# Patient Record
Sex: Male | Born: 1935
Health system: Southern US, Community
[De-identification: ages and names within clinical notes are randomized; demographics above are authoritative.]

## PROBLEM LIST (undated history)

## (undated) DIAGNOSIS — H9319 Tinnitus, unspecified ear: Secondary | ICD-10-CM

## (undated) DIAGNOSIS — R972 Elevated prostate specific antigen [PSA]: Secondary | ICD-10-CM

## (undated) DIAGNOSIS — Z860100 Personal history of colon polyps, unspecified: Secondary | ICD-10-CM

## (undated) DIAGNOSIS — M545 Low back pain, unspecified: Secondary | ICD-10-CM

## (undated) DIAGNOSIS — R498 Other voice and resonance disorders: Secondary | ICD-10-CM

## (undated) DIAGNOSIS — K219 Gastro-esophageal reflux disease without esophagitis: Secondary | ICD-10-CM

## (undated) HISTORY — PX: TONSILLECTOMY: SHX5217

## (undated) HISTORY — DX: Gastro-esophageal reflux disease without esophagitis: K21.9

## (undated) HISTORY — DX: Low back pain: M54.5

## (undated) HISTORY — PX: CATARACT EXTRACTION W/ INTRAOCULAR LENS  IMPLANT, BILATERAL: SHX1307

## (undated) HISTORY — DX: Elevated prostate specific antigen (PSA): R97.20

## (undated) HISTORY — PX: OTHER SURGICAL HISTORY: SHX169

## (undated) HISTORY — DX: Low back pain, unspecified: M54.50

## (undated) HISTORY — DX: Tinnitus, unspecified ear: H93.19

## (undated) HISTORY — DX: Personal history of colon polyps, unspecified: Z86.0100

## (undated) HISTORY — DX: Personal history of colonic polyps: Z86.010

## (undated) HISTORY — DX: Other voice and resonance disorders: R49.8

---

## 1998-11-30 HISTORY — PX: CATARACT EXTRACTION: SUR2

## 2000-03-25 ENCOUNTER — Other Ambulatory Visit: Admission: RE | Admit: 2000-03-25 | Discharge: 2000-03-25 | Payer: Self-pay | Admitting: Gastroenterology

## 2000-03-25 ENCOUNTER — Encounter (INDEPENDENT_AMBULATORY_CARE_PROVIDER_SITE_OTHER): Payer: Self-pay | Admitting: Specialist

## 2005-04-03 ENCOUNTER — Ambulatory Visit: Payer: Self-pay | Admitting: Internal Medicine

## 2005-04-14 ENCOUNTER — Ambulatory Visit: Payer: Self-pay

## 2005-04-21 ENCOUNTER — Ambulatory Visit: Payer: Self-pay | Admitting: Internal Medicine

## 2005-05-28 ENCOUNTER — Ambulatory Visit: Payer: Self-pay | Admitting: Internal Medicine

## 2006-03-23 ENCOUNTER — Ambulatory Visit: Payer: Self-pay | Admitting: Internal Medicine

## 2006-03-29 ENCOUNTER — Ambulatory Visit: Payer: Self-pay | Admitting: Internal Medicine

## 2006-03-31 ENCOUNTER — Ambulatory Visit: Payer: Self-pay | Admitting: Internal Medicine

## 2007-03-08 ENCOUNTER — Ambulatory Visit: Payer: Self-pay | Admitting: Internal Medicine

## 2007-11-14 ENCOUNTER — Encounter: Payer: Self-pay | Admitting: Internal Medicine

## 2007-11-14 ENCOUNTER — Ambulatory Visit: Payer: Self-pay | Admitting: Internal Medicine

## 2007-11-14 DIAGNOSIS — R972 Elevated prostate specific antigen [PSA]: Secondary | ICD-10-CM

## 2007-11-14 DIAGNOSIS — Z860100 Personal history of colon polyps, unspecified: Secondary | ICD-10-CM | POA: Insufficient documentation

## 2007-11-14 DIAGNOSIS — Z8619 Personal history of other infectious and parasitic diseases: Secondary | ICD-10-CM

## 2007-11-14 DIAGNOSIS — Z8719 Personal history of other diseases of the digestive system: Secondary | ICD-10-CM

## 2008-04-30 ENCOUNTER — Ambulatory Visit: Payer: Self-pay | Admitting: Internal Medicine

## 2008-04-30 DIAGNOSIS — R498 Other voice and resonance disorders: Secondary | ICD-10-CM

## 2008-04-30 DIAGNOSIS — I739 Peripheral vascular disease, unspecified: Secondary | ICD-10-CM

## 2008-05-07 ENCOUNTER — Encounter: Payer: Self-pay | Admitting: Internal Medicine

## 2008-07-30 ENCOUNTER — Encounter: Payer: Self-pay | Admitting: Internal Medicine

## 2009-02-27 ENCOUNTER — Encounter (INDEPENDENT_AMBULATORY_CARE_PROVIDER_SITE_OTHER): Payer: Self-pay | Admitting: *Deleted

## 2009-06-17 ENCOUNTER — Ambulatory Visit: Payer: Self-pay | Admitting: Internal Medicine

## 2009-06-17 DIAGNOSIS — H9319 Tinnitus, unspecified ear: Secondary | ICD-10-CM | POA: Insufficient documentation

## 2009-07-15 ENCOUNTER — Encounter: Payer: Self-pay | Admitting: Internal Medicine

## 2009-10-07 ENCOUNTER — Encounter: Payer: Self-pay | Admitting: Internal Medicine

## 2009-10-28 ENCOUNTER — Encounter: Payer: Self-pay | Admitting: Internal Medicine

## 2010-01-14 ENCOUNTER — Encounter: Payer: Self-pay | Admitting: Internal Medicine

## 2010-04-04 ENCOUNTER — Ambulatory Visit: Payer: Self-pay | Admitting: Internal Medicine

## 2010-04-04 DIAGNOSIS — M545 Low back pain: Secondary | ICD-10-CM

## 2010-04-25 ENCOUNTER — Encounter: Payer: Self-pay | Admitting: Internal Medicine

## 2010-12-30 NOTE — Consult Note (Signed)
Summary: [facility]  [facility]   Imported By: Lester Glenwood 05/01/2010 11:48:53  _____________________________________________________________________  External Attachment:    Type:   Image     Comment:   External Document

## 2010-12-30 NOTE — Letter (Signed)
Summary: Health Screening/HRA  Health Screening/HRA   Imported By: Sherian Rein 02/24/2010 13:17:19  _____________________________________________________________________  External Attachment:    Type:   Image     Comment:   External Document

## 2010-12-30 NOTE — Assessment & Plan Note (Signed)
Summary: YEARLY FU/MEDICARE/ STATES INSURANCE WILL PAY IF EARLY/NWS   Vital Signs:  Patient profile:   77 year old male Height:      69 inches Weight:      148 pounds BMI:     21.93 O2 Sat:      95 % on Room air Temp:     98.4 degrees F oral Pulse rate:   57 / minute BP sitting:   110 / 80  (left arm) Cuff size:   regular  Vitals Entered By: Bill Salinas CMA (Apr 04, 2010 11:33 AM)  O2 Flow:  Room air CC: cpx/ ab   Primary Care Provider:  Joani Cosma  CC:  cpx/ ab.  History of Present Illness: Patient presents for routine medical follow-up. He reports since his cataract surgey he has worsening presbyopia; he has tinnitis with full evaluation with ENT and follow-up. He reports the symptoms are intermittent. He has low back pain which is chronic problem. He did see improvement with routine back exercise. He finds that driving makes it worse. Using a lumbar support when driving reduces the amount of discomfort. He denies weakness, loss of sensation, incontinence or limitation in his ADLs.   Current Medications (verified): 1)  Aspirin 81 Mg  Tabs (Aspirin) .... Take 1 Tablet By Mouth Once A Day 2)  Omeprazole 40 Mg  Cpdr (Omeprazole) .Marland Kitchen.. 1 By Mouth Qam  Allergies (verified): No Known Drug Allergies  Past History:  Past Medical History: LOW BACK PAIN, CHRONIC (ICD-724.2) TINNITUS, CHRONIC, BILATERAL (ICD-388.30) PVD (ICD-443.9) HOARSENESS, CHRONIC (ICD-784.49) * TRANSRECTAL PROSTATE BIOPSIES THAT WERE NEGATIVE Hx of ELEVATED PROSTATE SPECIFIC ANTIGEN (ICD-790.93) MUMPS, HX OF (ICD-V12.09) DIVERTICULITIS, HX OF (ICD-V12.79) COLONIC POLYPS, HX OF (ICD-V12.72)  Past Surgical History: Tonsillectomy * PUNCH BIOPSY OF A LESION ON FOREHEAD * TRANSRECTAL PROSTATE BIOPSIES THAT WERE NEGATIVE BILATERAL CATARACT EXTRACTION WITH IOL  Review of Systems  The patient denies anorexia, fever, weight loss, weight gain, hoarseness, chest pain, dyspnea on exertion, peripheral edema,  headaches, hemoptysis, abdominal pain, hematochezia, incontinence, muscle weakness, transient blindness, difficulty walking, unusual weight change, enlarged lymph nodes, and angioedema.    Physical Exam  General:  Well-developed,well-nourished,in no acute distress; alert,appropriate and cooperative throughout examination Head:  Normocephalic and atraumatic without obvious abnormalities. No apparent alopecia or balding. Eyes:  vision grossly intact, pupils equal, and pupils round.   Ears:  R ear normal and L ear normal.   Nose:  no external deformity and no external erythema.   Mouth:  good dentition and no dental plaque.   Neck:  No deformities, masses, or tenderness noted. Chest Wall:  No deformities, masses, tenderness or gynecomastia noted. Lungs:  Normal respiratory effort, chest expands symmetrically. Lungs are clear to auscultation, no crackles or wheezes. Heart:  Normal rate and regular rhythm. S1 and S2 normal without gallop, murmur, click, rub or other extra sounds. Abdomen:  soft, non-tender, normal bowel sounds, no distention, and no hepatomegaly.   Rectal:  no external abnormalities and normal sphincter tone.   Genitalia:  circumcised.   Prostate:  Prostate gland firm and smooth, no enlargement, nodularity, tenderness, mass, asymmetry or induration. Msk:  normal ROM, no joint tenderness, no joint swelling, no redness over joints, and no joint deformities.   Pulses:  2+ radial, 2+ PT, 1+ DP, good capillary refill Extremities:  No clubbing, cyanosis, edema, or deformity noted with normal full range of motion of all joints.   Neurologic:  alert & oriented X3, cranial nerves II-XII intact, strength normal  in all extremities, gait normal, and DTRs symmetrical and normal.   Skin:  turgor normal, color normal, no suspicious lesions, and no ulcerations.   Cervical Nodes:  no anterior cervical adenopathy and no posterior cervical adenopathy.   Axillary Nodes:  no R axillary adenopathy and  no L axillary adenopathy.   Psych:  Oriented X3, memory intact for recent and remote, and normally interactive.     Impression & Recommendations:  Problem # 1:  LOW BACK PAIN, CHRONIC (ICD-724.2) Patient with a normal back exam - no radicular symptoms. Suspect DJD related chronic back pain.  Plan - continue to do back exercises.           continue to use low back/lumbar spine support when driving.  His updated medication list for this problem includes:    Aspirin 81 Mg Tabs (Aspirin) .Marland Kitchen... Take 1 tablet by mouth once a day  Problem # 2:  TINNITUS, CHRONIC, BILATERAL (ICD-388.30) Patient with tinnitis. He has seen Dr. Jenne Pane and is scheduled for follow-up with audiology. He may be a candidate for amplification.  Problem # 3:  PVD (ICD-443.9) Patient with a positive family h/o PAD and a heightened sense of concern. Exam today reveals excellent posterior tibial pulse with a weak Doralis pedis pulse. He has good capillary refill and absolutely no signs of PAD.  Plan - routine follow-up  Problem # 4:  Hx of ELEVATED PROSTATE SPECIFIC ANTIGEN (ICD-790.93) Normal prostate exam. He previous workup was negative.  Problem # 5:  Preventive Health Care (ICD-V70.0) Normal exam. Patient has lab done at the [place]. He has had normal labs in the past. He will forward to me labs when done at [place]. He is current for colorectal cancer screening. Pneumonia vaccine in '04, zoster vaccine in '07.  IN summary  a healthy appearing man, looking younger than his stated age who appears medically stable. He will return in 1 year or as needed.   Complete Medication List: 1)  Aspirin 81 Mg Tabs (Aspirin) .... Take 1 tablet by mouth once a day 2)  Omeprazole 40 Mg Cpdr (Omeprazole) .Marland Kitchen.. 1 by mouth qam   Not Administered:    Influenza Vaccine not given due to: declined

## 2011-04-03 ENCOUNTER — Other Ambulatory Visit (INDEPENDENT_AMBULATORY_CARE_PROVIDER_SITE_OTHER): Payer: Medicare Other | Admitting: Internal Medicine

## 2011-04-03 ENCOUNTER — Other Ambulatory Visit (INDEPENDENT_AMBULATORY_CARE_PROVIDER_SITE_OTHER): Payer: Medicare Other

## 2011-04-03 ENCOUNTER — Other Ambulatory Visit: Payer: Self-pay | Admitting: Internal Medicine

## 2011-04-03 DIAGNOSIS — Z79899 Other long term (current) drug therapy: Secondary | ICD-10-CM

## 2011-04-03 DIAGNOSIS — Z0389 Encounter for observation for other suspected diseases and conditions ruled out: Secondary | ICD-10-CM

## 2011-04-03 DIAGNOSIS — Z125 Encounter for screening for malignant neoplasm of prostate: Secondary | ICD-10-CM

## 2011-04-03 DIAGNOSIS — Z0000 Encounter for general adult medical examination without abnormal findings: Secondary | ICD-10-CM

## 2011-04-03 DIAGNOSIS — Z13220 Encounter for screening for lipoid disorders: Secondary | ICD-10-CM

## 2011-04-03 LAB — LIPID PANEL
Cholesterol: 222 mg/dL — ABNORMAL HIGH (ref 0–200)
HDL: 100.4 mg/dL (ref >39.00)
Triglycerides: 32 mg/dL (ref 0.0–149.0)

## 2011-04-03 LAB — URINALYSIS
Bilirubin Urine: NEGATIVE
Hgb urine dipstick: NEGATIVE
Ketones, ur: NEGATIVE
Nitrite: NEGATIVE
Total Protein, Urine: NEGATIVE
Urine Glucose: NEGATIVE
pH: 7 (ref 5.0–8.0)

## 2011-04-03 LAB — HEPATIC FUNCTION PANEL
ALT: 16 U/L (ref 0–53)
AST: 19 U/L (ref 0–37)
Albumin: 3.9 g/dL (ref 3.5–5.2)
Alkaline Phosphatase: 62 U/L (ref 39–117)

## 2011-04-03 LAB — CBC WITH DIFFERENTIAL/PLATELET
Basophils Absolute: 0 10*3/uL (ref 0.0–0.1)
Eosinophils Absolute: 0.3 10*3/uL (ref 0.0–0.7)
Hemoglobin: 15.8 g/dL (ref 13.0–17.0)
Lymphocytes Relative: 25.1 % (ref 12.0–46.0)
Lymphs Abs: 2 10*3/uL (ref 0.7–4.0)
MCHC: 33.8 g/dL (ref 30.0–36.0)
Monocytes Relative: 8.2 % (ref 3.0–12.0)
Neutro Abs: 5 10*3/uL (ref 1.4–7.7)
Platelets: 153 10*3/uL (ref 150.0–400.0)
RDW: 13.8 % (ref 11.5–14.6)

## 2011-04-03 LAB — BASIC METABOLIC PANEL
Chloride: 105 mEq/L (ref 96–112)
GFR: 53.35 mL/min — ABNORMAL LOW (ref >60.00)
Potassium: 4.5 mEq/L (ref 3.5–5.1)
Sodium: 140 mEq/L (ref 135–145)

## 2011-04-03 LAB — PSA: PSA: 2.03 ng/mL (ref 0.10–4.00)

## 2011-04-03 LAB — TSH: TSH: 1.92 u[IU]/mL (ref 0.35–5.50)

## 2011-04-09 ENCOUNTER — Encounter: Payer: Self-pay | Admitting: Internal Medicine

## 2011-04-10 ENCOUNTER — Encounter: Payer: Self-pay | Admitting: Internal Medicine

## 2011-04-10 ENCOUNTER — Ambulatory Visit (INDEPENDENT_AMBULATORY_CARE_PROVIDER_SITE_OTHER): Payer: Medicare Other | Admitting: Internal Medicine

## 2011-04-10 VITALS — BP 108/60 | HR 68 | Temp 98.8°F | Resp 14 | Ht 67.0 in | Wt 150.4 lb

## 2011-04-10 DIAGNOSIS — Z136 Encounter for screening for cardiovascular disorders: Secondary | ICD-10-CM

## 2011-04-10 NOTE — Progress Notes (Signed)
Subjective:    Patient ID: Nicholas Frye, male    DOB: July 25, 1930, 77 y.o.   MRN: [id number]  HPI The patient is here for annual Medicare wellness examination and management of other chronic and acute problems. He is feeling well and doing well.   The risk factors are reflected in the social history.  The roster of all physicians providing medical care to patient - is listed in the Snapshot section of the chart.  Activities of daily living:  The patient is 100% inedpendent in all ADLs: dressing, toileting, feeding as well as independent mobility  Home safety : The patient has smoke detectors in the home. They wear seatbelts.  Firearms are present in the home, kept in a safe fashion. There is no violence in the home.   There is no risks for hepatitis, STDs or HIV. There is no  history of blood transfusion. They have no travel history to infectious disease endemic areas of the world.  The patient has seen their dentist in the last six month. They have seen their eye doctor in the last year. They admit to any hearing difficulty and have had audiologic testing in the last year.  They do not  have excessive sun exposure. Discussed the need for sun protection: hats, long sleeves and use of sunscreen if there is significant sun exposure.   Diet: the importance of a healthy diet is discussed. They do have a healthy  diet.  The patient has a regular exercise program: swimming , 35 min duration,  3per week.  The benefits of regular aerobic exercise were discussed.  Depression screen: there are no signs or vegative symptoms of depression- irritability, change in appetite, anhedonia, sadness/tearfullness.  Cognitive assessment: the patient manages all their financial and personal affairs and is actively engaged. They could relate day,date,year and events; recalled 3/3 objects at 3 minutes; performed clock-face test normally.  The following portions of the patient's history were reviewed and  updated as appropriate: allergies, current medications, past family history, past medical history,  past surgical history, past social history  and problem list.  Vision, hearing, body mass index were assessed and reviewed.   During the course of the visit the patient was educated and counseled about appropriate screening and preventive services including : fall prevention , diabetes screening, nutrition counseling, colorectal cancer screening, and recommended immunizations.  Past Medical History  Diagnosis Date  . Lumbago   . Unspecified tinnitus   . Peripheral vascular disease, unspecified   . 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 Elevated prostate specific antigen (PSA)   . Personal history of other infectious and parasitic disease   . Personal history of other diseases of digestive system   . Personal history of colonic polyps   . GERD (gastroesophageal reflux disease)    Past Surgical History  Procedure Date  . Tonsillectomy   . Punch biopsy of a lesion on forehead   . Transrectal prostate biopsies that were negative   . Cataract extraction w/ intraocular lens  implant, bilateral    Family History  Problem Relation Age of Onset  . Peripheral vascular disease Mother   . Other Mother     bilateral Leg amputation  . Coronary artery disease Father   . Heart attack Father   . Heart disease Father     heart failure/ acute MI  . Peripheral vascular disease Sister   . Coronary artery disease Brother  valve replaced  . Heart disease Brother     MI x2, CABG, AVR  . Cancer Neg Hx     colon or prostate  . Diabetes Neg Hx    History   Social History  . Marital Status: Married    Spouse Name: N/A    Number of Children: N/A  . Years of Education: N/A   Occupational History  .      retired-still works at [employer]   Social History Main Topics  . Smoking status: Never Smoker   . Smokeless tobacco: Not on file  . Alcohol Use: 1.0 oz/week     2 drink(s) per week  . Drug Use: No  . Sexually Active: No   Other Topics Concern  . Not on file   Social History Narrative   HSG. Married '59. 2 daughters - '68, '71; 2 grandsons, 1 granddaughter, 2 step-grandchildren. Work: retired from [employer], works at [employer] 2 days /week.  End of life: DNR, no prolonged mechanical ventilation, no heroic or futile measures.    Current Outpatient Prescriptions on File Prior to Visit  Medication Sig Dispense Refill  . aspirin 81 MG tablet Take 81 mg by mouth daily.        Marland Kitchen omeprazole (PRILOSEC) 40 MG capsule Take 40 mg by mouth every other day.            Review of Systems Review of Systems  Constitutional:  Negative for fever, chills, activity change and unexpected weight change.  HENT:  Negative for hearing loss, ear pain, congestion, neck stiffness and postnasal drip.   Eyes: Negative for pain, discharge and visual disturbance.  Respiratory: Negative for chest tightness and wheezing.   Cardiovascular: Negative for chest pain and palpitations.       [No decreased exercise tolerance Gastrointestinal: [No change in bowel habit. No bloating or gas. No reflux or indigestion Genitourinary: Negative for urgency, frequency, flank pain and difficulty urinating.  Musculoskeletal: Negative for myalgias, back pain, arthralgias and gait problem.  Neurological: Negative for dizziness, tremors, weakness and headaches.  Hematological: Negative for adenopathy.  Psychiatric/Behavioral: Negative for behavioral problems and dysphoric mood.       Objective:   Physical Exam Constitutional: He is oriented to person, place, and time. He appears well-developed and well-nourished.       Healthy appearing white male in no acute distress  HENT:  Head: Normocephalic and atraumatic.  Right Ear: External ear normal. EAC/TM nl Left Ear: External ear normal.  EAC/TM nl Nose: Nose normal.  Mouth/Throat: Oropharynx is clear and moist.  Eyes:  Conjunctivae and EOM are normal. Pupils are equal, round, and reactive to light. Right eye exhibits no discharge. Left eye exhibits no discharge. No scleral icterus.  Neck: Normal range of motion. Neck supple. No JVD present. No tracheal deviation present. No thyromegaly present.  Cardiovascular: Normal rate, regular rhythm and normal heart sounds.  Exam reveals no gallop and no friction rub.   No murmur heard.      Quiet precordium. 2+ radial, 1+ to trace DP, 2+ PT  Pulses. Mildly slowed capillary refill distal foot and toes.  Pulmonary/Chest: Effort normal. No respiratory distress. He has no wheezes. He has no rales. He exhibits no tenderness.       No chest wall deformity  Abdominal: Soft. Bowel sounds are normal. He exhibits no distension. There is no tenderness. There is no rebound and no guarding.       No heptosplenomegaly  Genitourinary: Prostate exam  deferred to normal PSA Musculoskeletal: Normal range of motion. He exhibits no edema and no tenderness.       Small and large joints without redness, synovial thickening or deformity. Full range of motion preserved about all small, median and large joints.  Lymphadenopathy:    He has no cervical adenopathy.  Neurological: He is alert and oriented to person, place, and time. He has normal reflexes. No cranial nerve deficit. Coordination normal.  Skin: Skin is warm and dry. No rash noted. No erythema.  Psychiatric: He has a normal mood and affect. His behavior is normal. Thought content normal.   Lab Results  Component Value Date   WBC 8.0 04/03/2011   HGB 15.8 04/03/2011   HCT 46.9 04/03/2011   PLT 153.0 04/03/2011   CHOL 222* 04/03/2011   TRIG 32.0 04/03/2011   HDL 100.40 04/03/2011   LDLDIRECT 101.3 04/03/2011   ALT 16 04/03/2011   AST 19 04/03/2011   NA 140 04/03/2011   K 4.5 04/03/2011   CL 105 04/03/2011   CREATININE 1.4 04/03/2011   BUN 17 04/03/2011   CO2 27 04/03/2011   TSH 1.92 04/03/2011   PSA 2.03 04/03/2011          Assessment & Plan:  1.  Healthy maintenance - interval history is unremarkable with no medical illness, surgery or injury. Physical exam is normal. Lab results reveal a superb lipid panel with an LDL/HDL ratio that is at unity!. He is current with colorectal cancer screening with last exam in '04. He is current with prostate cancer screening with PSA as noted. Immunizations: Tetnus '04; pneumonia vaccine '04; Shingels '07.  12 Lead EKG without ischemia or sign of injury.  In summary- a very nice gentleman who is medically stable and doing well. He will continue to take omeprazole for minor dyspepsia and aspirin as prophylaxis. He will return as needed or in 1 year.

## 2011-04-17 NOTE — Assessment & Plan Note (Signed)
[facility]                           PRIMARY CARE OFFICE NOTE   Nicholas Frye, Nicholas Frye                   MRN:          [id number]  DATE:03/08/2007                            DOB:          01/01/1930    Nicholas Frye is a pleasant 77 year old gentleman who presents today for  followup evaluation and exam.  He reports that in the interval since his  last examination of March 29, 2006 that he has been doing well, with no  significant change in his medical condition.  Of note after his last  physical exam, he did get the Zostavax immunization at [facility] and was reimbursed by his insurance company.   PAST MEDICAL HISTORY:  Past medical history is well-documented in my  note of Apr 03, 2005 as is his family history and social history with no  significant changes, except he now has three grandchildren, two step-  grandchildren.  He has been married for 49 years.  He does continue to  work two days a week at the [employer].   CURRENT MEDICATIONS:  Aspirin 325 mg daily.   REVIEW OF SYSTEMS:  The patient has had no fevers, sweats, chills, or  other constitutional symptoms.  His last eye exam was in the last 12  months, and he does have a cataract on the left eye and may be a  candidate for extraction in the future.  The patient reports he needs a  new crown but otherwise no dental problems.  No cardiovascular,  respiratory, or GI complaints.  GU is significant for nocturia x1-2 and  mild symptoms of prostatism with decreased force of stream and  occasional postvoid dribble.  Musculoskeletal review is significant for  right shoulder discomfort and ongoing low back pain, although improved  with regular back exercises.  No dermatologic or neurologic complaints.   PHYSICAL EXAMINATION:  VITAL SIGNS:  Temperature 98.3, blood pressure  128/70, pulse 62, weight 155.  GENERAL:  This is a well-nourished, well-developed gentleman who  looks  his stated age and is in no acute distress.  HEENT:  Normocephalic, atraumatic.  EACs and TMs were unremarkable.  Oropharynx with native dentition in good repair.  No buccal or palatal  lesions were noted.  The posterior pharynx was clear.  Conjunctivae and  sclerae were clear.  PERRLA.  EOMI.  Funduscopic exam revealed normal  disk margins with no vascular abnormalities.  I did not appreciate any  significant lens opacification on the left.  NECK:  Supple without thyromegaly.  No lymphadenopathy was noted in the  cervical or supraclavicular regions.  CHEST:  No CVA tenderness.  Lungs were clear to auscultation and  percussion.  CARDIOVASCULAR:  2+ radial pulses.  He had a weak dorsalis pedis pulse  bilaterally.  He had normal posterior tibial pulses.  He had no JVD, no  carotid bruits.  He had a quiet precordium with a regular rate and  rhythm without murmurs, rubs, or gallops.  ABDOMEN:  Soft.  No guarding or rebound.  No organosplenomegaly was  noted.  RECTAL:  Normal sphincter tone.  The  prostate was smooth, round, and  normal in size and contour with well-preserved sulcus.  EXTREMITIES:  Without clubbing, cyanosis, edema, or deformity.  NEUROLOGIC:  Nonfocal.  DERMATOLOGIC:  The patient has no worrisome skin lesions.  He has  several cherry angiomas.  He has a non-worrisome mole on his left  axilla.   The patient did not have new laboratory, having had laboratory most  recent in August of 2007 at [facility] which revealed normal  urinalysis.  His total cholesterol was normal at 197 with an HDL of 88.7  and an LDL of 102.  PSA was 0.75.  White count was 5700 with a normal  hemoglobin of 15.5 g.   ASSESSMENT AND PLAN:  The patient is very stable at this time with no  significant active medical problems.  He does appear to be doing well.  We did discuss labs, and he does not need labs given his recent study.  The chart was reviewed.  He did have a colonoscopy in April  of 2004 with  a recall in 2014.  He did have lower extremity arterial Doppler studies  on Apr 14, 2005 which showed normal ankle-brachial index, slightly  decreased toe brachial index, left greater than right; and slightly  decreased toe pressures.   In summary, this is a very pleasant gentleman who does seem medically  stable at this time.  He is asked to return to see me in one year or on  a p.r.n. basis.     Nicholas Gess Norins, MD  Electronically Signed    MEN/MedQ  DD: 03/08/2007  DT: 03/08/2007  Job #: [id number]   cc:   Ileana Ladd

## 2011-05-28 ENCOUNTER — Emergency Department (HOSPITAL_COMMUNITY)
Admission: EM | Admit: 2011-05-28 | Discharge: 2011-05-28 | Disposition: A | Discharge status: Home / Self Care | Payer: Medicare Other | Attending: Emergency Medicine | Admitting: Emergency Medicine

## 2011-05-28 DIAGNOSIS — T6391XA Toxic effect of contact with unspecified venomous animal, accidental (unintentional), initial encounter: Secondary | ICD-10-CM | POA: Insufficient documentation

## 2011-05-28 DIAGNOSIS — R55 Syncope and collapse: Secondary | ICD-10-CM | POA: Insufficient documentation

## 2011-05-28 DIAGNOSIS — T63461A Toxic effect of venom of wasps, accidental (unintentional), initial encounter: Secondary | ICD-10-CM | POA: Insufficient documentation

## 2011-06-01 ENCOUNTER — Telehealth: Payer: Self-pay | Admitting: *Deleted

## 2011-06-01 NOTE — Telephone Encounter (Signed)
k

## 2011-06-01 NOTE — Telephone Encounter (Signed)
Spoke w/pt's Wife and scheduled apt for tomorrow am per pt's request. Pt has had 2 yellow jacket stings recently - First one, pt had reaction, per wife - "couldn't move" and EMS was called. He was treated and then received another sting but reaction was not as severe due to steroids and wife giving him benadryl quickly.   He would like eval tomorrow b/c c/o back pain. Possibly pulled muscle in the pool w/grandkids.

## 2011-06-02 ENCOUNTER — Ambulatory Visit (INDEPENDENT_AMBULATORY_CARE_PROVIDER_SITE_OTHER)
Admission: RE | Admit: 2011-06-02 | Discharge: 2011-06-02 | Disposition: A | Discharge status: Home / Self Care | Payer: Medicare Other | Source: Ambulatory Visit | Attending: Internal Medicine | Admitting: Internal Medicine

## 2011-06-02 ENCOUNTER — Ambulatory Visit (INDEPENDENT_AMBULATORY_CARE_PROVIDER_SITE_OTHER): Payer: Medicare Other | Admitting: Internal Medicine

## 2011-06-02 VITALS — BP 112/64 | HR 69 | Temp 97.7°F | Wt 151.0 lb

## 2011-06-02 DIAGNOSIS — T7840XA Allergy, unspecified, initial encounter: Secondary | ICD-10-CM

## 2011-06-02 DIAGNOSIS — R0789 Other chest pain: Secondary | ICD-10-CM

## 2011-06-02 DIAGNOSIS — R071 Chest pain on breathing: Secondary | ICD-10-CM

## 2011-06-02 DIAGNOSIS — Z91038 Other insect allergy status: Secondary | ICD-10-CM | POA: Insufficient documentation

## 2011-06-02 NOTE — Assessment & Plan Note (Signed)
Reviewed with patient. He is instructed to keep Epi-pen handy if working out of doors. After a sting(s) he should use epi pen and go to ED

## 2011-06-02 NOTE — Patient Instructions (Signed)
Chest wall pain - possible broken rib after fall. Plan - will check rib details. Treatment is either tylenol or over the counter anti-inflammatory drugs, e.g. Aleve, heat patches may help. Hold a pillow against the chest when you cough or sneeze.   Allergic reaction to stinging insects - keep a epi-pen at hand. People will have progressively severe reactions with repeated exposure!!  Bee, Wasp, or Hornet Sting Your caregiver has diagnosed you as having an insect sting. An insect sting appears as a red lump in the skin that sometimes has a tiny hole in the center, or it may have a stinger in the center of the wound. The most common stings are from wasps, hornets and bees. Individuals have different reactions to insect stings.  A normal reaction may cause pain, swelling, and redness around the sting site.   A localized allergic reaction may cause swelling and redness that extends beyond the sting site.   A large local reaction may continue to develop over the next 12 to 36 hours.   On occasion, the reactions can be severe (anaphylactic reaction). An anaphylactic reaction may cause wheezing; difficulty breathing; chest pain; fainting; raised, itchy, red patches on the skin; a sick feeling to your stomach (nausea); vomiting; cramping; or diarrhea. If you have had an anaphylactic reaction to an insect sting in the past, you are more likely to have one again.  HOME CARE INSTRUCTIONS  With bee stings, a small sac of poison is left in the wound. Brushing across this with something such as a credit card, or anything similar, will help remove this and decrease the amount of the reaction. This same procedure will not help a wasp sting as they do not leave behind a stinger and poison sac.   Apply a cold compress for 10 to 20 minutes every hour for 1 to 2 days, depending on severity, to reduce swelling and itching.   To lessen pain, a paste made of water and baking soda, may be rubbed on the bite or sting  and left on for 5 minutes.   To relieve itching and swelling, you may use take medication or apply medicated creams or lotions as directed.   Only take over-the-counter or prescription medicines for pain, discomfort, or fever as directed by your caregiver.   Wash the sting site daily with soap and water. Apply antibiotic ointment on the sting site as directed.   If you suffered a severe reaction:   If you did not require hospitalization, an adult will need to stay with you for 24 hours in case the symptoms return.   You may need to wear a medical bracelet or necklace stating the allergy.   You and your family need to learn when and how to use an anaphylaxis kit or epinephrine injection.   If you have had a severe reaction before, always carry your anaphylaxis kit with you.  SEEK MEDICAL CARE IF:  None of the above helps within 2 to 3 days.   The area becomes red, warm, tender, and swollen beyond the area of the bite or sting.   You have an oral temperature above 100.  SEEK IMMEDIATE MEDICAL CARE IF: You have symptoms of an allergic reaction which are:  Wheezing.   Difficulty breathing.     Chest pain.     Lightheadedness or fainting.   Itchy, raised, red patches on the skin.     Nausea, vomiting, cramping or diarrhea.     ANY OF THESE SYMPTOMS  MAY REPRESENT A SERIOUS PROBLEM THAT IS AN EMERGENCY. Do not wait to see if the symptoms will go away. Get medical help right away. Call 911. DO NOT drive yourself to the hospital. MAKE SURE YOU:  Understand these instructions.   Will watch your condition.   Will get help right away if you are not doing well or get worse.  Document Released: 11/16/2005 Document Re-Released: 05/06/2010 [facility] Patient Information 2011 [place], [place].

## 2011-06-02 NOTE — Progress Notes (Signed)
Subjective:    Patient ID: Nicholas Frye, male    DOB: [birth year], 77 y.o.   MRN: [id number]  HPI Patient was recently stung by yellow jackets and had 6-8 stings. Following this he had a reaction where he was unable to move. He had facial swelling and reports that he had difficulty with swallowing for several hours. He was seen in the ED and released with an EpiPen. Friday he got restung Friday, after poisoning the nest, and when getting to the benadryl he fell striking his left chest at the axillary line,   Past Medical History  Diagnosis Date  . Lumbago   . Unspecified tinnitus   . Peripheral vascular disease, unspecified   . 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 Elevated prostate specific antigen (PSA)   . Personal history of other infectious and parasitic disease   . Personal history of other diseases of digestive system   . Personal history of colonic polyps   . GERD (gastroesophageal reflux disease)    Past Surgical History  Procedure Date  . Tonsillectomy   . Punch biopsy of a lesion on forehead   . Transrectal prostate biopsies that were negative   . Cataract extraction w/ intraocular lens  implant, bilateral    Family History  Problem Relation Age of Onset  . Peripheral vascular disease Mother   . Other Mother     bilateral Leg amputation  . Coronary artery disease Father   . Heart attack Father   . Heart disease Father     heart failure/ acute MI  . Peripheral vascular disease Sister   . Coronary artery disease Brother     valve replaced  . Heart disease Brother     MI x2, CABG, AVR  . Cancer Neg Hx     colon or prostate  . Diabetes Neg Hx    History   Social History  . Marital Status: Married    Spouse Name: N/A    Number of Children: N/A  . Years of Education: N/A   Occupational History  .      retired-still works at [employer]   Social History Main Topics  . Smoking status: Never Smoker   . Smokeless tobacco: Not  on file  . Alcohol Use: 1.0 oz/week    2 drink(s) per week  . Drug Use: No  . Sexually Active: No   Other Topics Concern  . Not on file   Social History Narrative   HSG. Married '59. 2 daughters - '68, '71; 2 grandsons, 1 granddaughter, 2 step-grandchildren. Work: retired from [employer], works at [employer] 2 days /week.  End of life: DNR, no prolonged mechanical ventilation, no heroic or futile measures.        Review of Systems Review of Systems  Constitutional:  Negative for fever, chills, activity change and unexpected weight change.  HEENT:  Negative for hearing loss, ear pain, congestion, neck stiffness and postnasal drip. Negative for sore throat or swallowing problems. Negative for dental complaints.   Eyes: Negative for vision loss or change in visual acuity.  Respiratory: Negative for chest tightness and wheezing.   Cardiovascular: Negative for chest pain and palpitation. No decreased exercise tolerance Gastrointestinal: No change in bowel habit. No bloating or gas. No reflux or indigestion Genitourinary: Negative for urgency, frequency, flank pain and difficulty urinating.  Musculoskeletal: Negative for myalgias, arthralgias and gait problem. Pain left chest Neurological: Negative for dizziness, tremors, weakness  and headaches.  Hematological: Negative for adenopathy.  Psychiatric/Behavioral: Negative for behavioral problems and dysphoric mood.       Objective:   Physical Exam        Assessment & Plan:  Chest wall pain - fell, striking left chest. Very tender on exam.  Plan - CXR (rib details verbally requested but not done (no order in epic)- no obvious rib fracture.

## 2011-06-04 ENCOUNTER — Telehealth: Payer: Self-pay | Admitting: Internal Medicine

## 2011-06-04 NOTE — Telephone Encounter (Signed)
Please call- chest x-ray normal with no evidence of rib fracture. thanks

## 2011-06-04 NOTE — Telephone Encounter (Signed)
Informed pt .

## 2011-06-04 NOTE — Telephone Encounter (Signed)
Pt states he is still experiencing the pain on his left side. Pt would like to know what he can do next? Please Advise

## 2011-06-04 NOTE — Telephone Encounter (Signed)
Heat, e.g. Bayer heat patches, otc NSAIDs, tincture of time

## 2012-02-15 ENCOUNTER — Encounter: Payer: Self-pay | Admitting: Internal Medicine

## 2012-02-15 ENCOUNTER — Ambulatory Visit (INDEPENDENT_AMBULATORY_CARE_PROVIDER_SITE_OTHER): Payer: Medicare Other | Admitting: Internal Medicine

## 2012-02-15 VITALS — BP 118/62 | HR 91 | Temp 97.6°F | Resp 16 | Ht 67.0 in | Wt 150.8 lb

## 2012-02-15 DIAGNOSIS — Z0000 Encounter for general adult medical examination without abnormal findings: Secondary | ICD-10-CM

## 2012-02-15 DIAGNOSIS — R972 Elevated prostate specific antigen [PSA]: Secondary | ICD-10-CM

## 2012-02-15 DIAGNOSIS — M545 Low back pain, unspecified: Secondary | ICD-10-CM

## 2012-02-15 DIAGNOSIS — R498 Other voice and resonance disorders: Secondary | ICD-10-CM

## 2012-02-15 MED ORDER — EPINEPHRINE 0.3 MG/0.3ML IJ DEVI: INTRAMUSCULAR | Status: DC

## 2012-02-15 NOTE — Assessment & Plan Note (Signed)
Much improved since doing daily back exercise.

## 2012-02-15 NOTE — Assessment & Plan Note (Signed)
NO change in voice: neither better or worse.

## 2012-02-15 NOTE — Progress Notes (Signed)
Subjective:    Patient ID: Nicholas Frye, male    DOB: 31-Jan-1930, 78 y.o.   MRN: [id number]  HPI The patient is here for annual Medicare wellness examination and management of other chronic and acute problems. He is feeling well: no major illness; no surgery; no injury. He did have a single sting but did not have a major reaction.    The risk factors are reflected in the social history.  The roster of all physicians providing medical care to patient - is listed in the Snapshot section of the chart.  Activities of daily living:  The patient is 100% inedpendent in all ADLs: dressing, toileting, feeding as well as independent mobility  Home safety : The patient has smoke detectors in the home. Falls - not completely fall safe. Advised to have grab bars. They wear seatbelts.  firearms are present in the home, kept in a safe fashion. There is no violence in the home.   There is no risks for hepatitis, STDs or HIV. There is no  history of blood transfusion. They have no travel history to infectious disease endemic areas of the world.  The patient has seen their dentist in the last six month. They have  seen their eye doctor in the last year. They admit to hearing difficulty and have not had audiologic testing in the last year. Hearing aid right ear. They do not  have excessive sun exposure. Discussed the need for sun protection: hats, long sleeves and use of sunscreen if there is significant sun exposure.   Diet: the importance of a healthy diet is discussed. They do have a healthy diet.  The patient has a regular exercise program: Y ,  30 minute swim or 30 stepper duration, 2 per week.  The benefits of regular aerobic exercise were discussed.  Depression screen: there are no signs or vegative symptoms of depression- irritability, change in appetite, anhedonia, sadness/tearfullness.  Cognitive assessment: the patient manages all their financial and personal affairs and is actively engaged.  They could relate day,date,year and events; recalled 3/3 objects at 3 minutes; performed clock-face test normally.  The following portions of the patient's history were reviewed and updated as appropriate: allergies, current medications, past family history, past medical history,  past surgical history, past social history  and problem list.  Vision, hearing, body mass index were assessed and reviewed.   During the course of the visit the patient was educated and counseled about appropriate screening and preventive services including : fall prevention , diabetes screening, nutrition counseling, colorectal cancer screening, and recommended immunizations.  Past Medical History  Diagnosis Date  . Lumbago   . Unspecified tinnitus     resolved  . 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 Elevated prostate specific antigen (PSA)     aged out  . Personal history of colonic polyps   . GERD (gastroesophageal reflux disease)    Past Surgical History  Procedure Date  . Tonsillectomy   . Punch biopsy of a lesion on forehead   . Transrectal prostate biopsies that were negative   . Cataract extraction w/ intraocular lens  implant, bilateral    Family History  Problem Relation Age of Onset  . Peripheral vascular disease Mother   . Other Mother     bilateral Leg amputation  . Coronary artery disease Father   . Heart attack Father   . Heart disease Father     heart failure/ acute MI  .  Peripheral vascular disease Sister   . Coronary artery disease Brother     valve replaced  . Heart disease Brother     MI x2, CABG, AVR  . Cancer Neg Hx     colon or prostate  . Diabetes Neg Hx    History   Social History  . Marital Status: Married    Spouse Name: N/A    Number of Children: 2  . Years of Education: 12   Occupational History  .      retired-still works at [employer]   Social History Main Topics  . Smoking status: Never Smoker   . Smokeless tobacco: Never  Used  . Alcohol Use: 1.0 oz/week    2 drink(s) per week     cocktail before dinner  . Drug Use: No  . Sexually Active: No   Other Topics Concern  . Not on file   Social History Narrative   HSG. Married '59. 2 daughters - '68, '71; 2 grandsons, 1 granddaughter, 2 step-grandchildren. Work: retired from [employer], works at [employer] 2 days /week.  End of life: DNR, no prolonged mechanical ventilation, no heroic or futile measures.        Review of Systems Constitutional:  Negative for fever, chills, activity change and unexpected weight change.  HEENT:  Negative for hearing loss, ear pain, congestion, neck stiffness and postnasal drip. Negative for sore throat or swallowing problems. Negative for dental complaints.   Eyes: Negative for vision loss or change in visual acuity.  Respiratory: Negative for chest tightness and wheezing. Negative for DOE.   Cardiovascular: Negative for chest pain or palpitations. No decreased exercise tolerance Gastrointestinal: No change in bowel habit. No bloating or gas. No reflux or indigestion Genitourinary: Negative for urgency, frequency, flank pain and difficulty urinating.  Musculoskeletal: Negative for myalgias, back pain, arthralgias and gait problem.  Neurological: Negative for dizziness, tremors, weakness and headaches.  Hematological: Negative for adenopathy.  Psychiatric/Behavioral: Negative for behavioral problems and dysphoric mood.       Objective:   Physical Exam Filed Vitals:   02/15/12 1437  BP: 118/62  Pulse: 91  Temp: 97.6 F (36.4 C)  Resp: 16   Wt Readings from Last 3 Encounters:  02/15/12 150 lb 12 oz (68.38 kg)  06/02/11 151 lb (68.493 kg)  04/10/11 150 lb 6 oz (68.21 kg)    Gen'l: Well nourished well developed white male in no acute distress  HEENT: Head: Normocephalic and atraumatic. Right Ear: External ear normal. Hearing aid in place. Left Ear: External ear normal.  EAC/TM nl. Nose: Nose normal.  Mouth/Throat: Oropharynx is clear and moist. Dentition - native, in good repair. No buccal or palatal lesions. Posterior pharynx clear. Eyes: Conjunctivae and sclera clear. EOM intact. Pupils are equal, round, and reactive to light. Right eye exhibits no discharge. Left eye exhibits no discharge. Neck: Normal range of motion. Neck supple. No JVD present. No tracheal deviation present. No thyromegaly present.  Cardiovascular: Normal rate, regular rhythm, no gallop, no friction rub, no murmur heard.      Quiet precordium. 2+ radial and DP pulses . No carotid bruits Pulmonary/Chest: Effort normal. No respiratory distress or increased WOB, no wheezes, no rales. No chest wall deformity or CVAT. Abdominal: Soft. Bowel sounds are normal in all quadrants. He exhibits no distension, no tenderness, no rebound or guarding, No heptosplenomegaly  Genitourinary:  deferred to age Musculoskeletal: Normal range of motion. He exhibits no edema and no tenderness.  Small and large joints without redness, synovial thickening or deformity. Full range of motion preserved about all small, median and large joints.  Lymphadenopathy:    He has no cervical or supraclavicular adenopathy.  Neurological: He is alert and oriented to person, place, and time. CN II-XII intact. DTRs 2+ and symmetrical biceps, radial and patellar tendons. Cerebellar function normal with no tremor, rigidity, normal gait and station.  Skin: Skin is warm and dry. No rash noted. No erythema. multiple cherry angiomas, no suspicious lesions face, neck, back, chest. Psychiatric: He has a normal mood and affect. His behavior is normal. Thought content normal.   No new lab: normal lab MAY '12 and more [facility] at [facility]      Assessment & Plan:

## 2012-02-15 NOTE — Assessment & Plan Note (Signed)
Interval history is benign. Physical exam is normal. No new labs - last labs exellent with LDL 101, HDL 100. Current with colorectal cancer screening. Immunizations are all up to date.  IN summary - a very nice man who is medically stable. He will continue his healthy life-style and return in 1 year or as needed.

## 2012-02-15 NOTE — Assessment & Plan Note (Signed)
Stable - aged out of screening.

## 2012-06-07 ENCOUNTER — Other Ambulatory Visit: Payer: Self-pay | Admitting: Internal Medicine

## 2013-02-27 ENCOUNTER — Encounter: Payer: Self-pay | Admitting: Gastroenterology

## 2013-02-28 ENCOUNTER — Ambulatory Visit (INDEPENDENT_AMBULATORY_CARE_PROVIDER_SITE_OTHER): Payer: Medicare Other | Admitting: Internal Medicine

## 2013-02-28 ENCOUNTER — Encounter: Payer: Self-pay | Admitting: Internal Medicine

## 2013-02-28 VITALS — BP 120/74 | HR 64 | Temp 97.5°F | Resp 20 | Ht 67.0 in | Wt 150.4 lb

## 2013-02-28 DIAGNOSIS — Z0000 Encounter for general adult medical examination without abnormal findings: Secondary | ICD-10-CM

## 2013-02-28 NOTE — Progress Notes (Signed)
Subjective:    Patient ID: Nicholas Frye, male    DOB: Sep 25, 1930, 79 y.o.   MRN: [id number]  HPI The patient is here for annual Medicare wellness examination and management of other chronic and acute problems.  He has had a good year. No major illness, no surgery and no injury. He is concerned about moles and skin changes.   The risk factors are reflected in the social history.  The roster of all physicians providing medical care to patient - is listed in the Snapshot section of the chart.  Activities of daily living:  The patient is 100% inedpendent in all ADLs: dressing, toileting, feeding as well as independent mobility  Home safety : The patient has smoke detectors in the home. Falls - no falls. Home not safe. They wear seatbelts.  firearms are present in the home, kept in a safe fashion. There is no violence in the home.   There is no risks for hepatitis, STDs or HIV. There is no   history of blood transfusion. They have no travel history to infectious disease endemic areas of the world.  The patient has seen their dentist in the last six month. They have seen their eye doctor in the last year. They admit to hearing difficulty and have not had audiologic testing in the last year.    They do not  have excessive sun exposure. Discussed the need for sun protection: hats, long sleeves and use of sunscreen if there is significant sun exposure.   Diet: the importance of a healthy diet is discussed. They do have a healthy diet.  The patient has a regular exercise program: swim , 30 min duration, 1 per week.  The benefits of regular aerobic exercise were discussed.  Depression screen: there are no signs or vegative symptoms of depression- irritability, change in appetite, anhedonia, sadness/tearfullness.  Cognitive assessment: the patient manages all their financial and personal affairs and is actively engaged.   The following portions of the patient's history were reviewed and  updated as appropriate: allergies, current medications, past family history, past medical history,  past surgical history, past social history  and problem list.  Past Medical History  Diagnosis Date  . Lumbago   . Unspecified tinnitus     resolved  . 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 Elevated prostate specific antigen (PSA)     aged out  . Personal history of colonic polyps   . GERD (gastroesophageal reflux disease)    Past Surgical History  Procedure Laterality Date  . Tonsillectomy    . Punch biopsy of a lesion on forehead    . Transrectal prostate biopsies that were negative    . Cataract extraction w/ intraocular lens  implant, bilateral     Family History  Problem Relation Age of Onset  . Peripheral vascular disease Mother   . Other Mother     bilateral Leg amputation  . Coronary artery disease Father   . Heart attack Father   . Heart disease Father     heart failure/ acute MI  . Peripheral vascular disease Sister   . Coronary artery disease Brother     valve replaced  . Heart disease Brother     MI x2, CABG, AVR  . Cancer Neg Hx     colon or prostate  . Diabetes Neg Hx    History   Social History  . Marital Status: Married    Spouse Name:  N/A    Number of Children: 2  . Years of Education: 12   Occupational History  .      retired-still works at [employer]   Social History Main Topics  . Smoking status: Never Smoker   . Smokeless tobacco: Never Used  . Alcohol Use: 1.0 oz/week    2 drink(s) per week     Comment: cocktail before dinner  . Drug Use: No  . Sexually Active: No   Other Topics Concern  . Not on file   Social History Narrative   HSG. Married '59. 2 daughters - '68, '71; 2 grandsons, 1 granddaughter, 2 step-grandchildren. Work: retired from [employer], works at [employer] 2 days /week.  End of life: DNR, no prolonged mechanical ventilation, no heroic or futile measures.     Current Outpatient  Prescriptions on File Prior to Visit  Medication Sig Dispense Refill  . aspirin 81 MG tablet Take 81 mg by mouth daily.        Marland Kitchen EPINEPHrine (EPIPEN 2-PAK) 0.3 mg/0.3 mL DEVI Use as Directed.  1 Device  3  . omeprazole (PRILOSEC) 40 MG capsule TAKE 1 CAPSULE EVERY MORNING  90 capsule  1   No current facility-administered medications on file prior to visit.    Vision, hearing, body mass index were assessed and reviewed.   During the course of the visit the patient was educated and counseled about appropriate screening and preventive services including : fall prevention , diabetes screening, nutrition counseling, colorectal cancer screening, and recommended immunizations.    Review of Systems Constitutional:  Negative for fever, chills, activity change and unexpected weight change.  HEENT:  Negative for hearing loss, ear pain, congestion, neck stiffness and postnasal drip. Negative for sore throat or swallowing problems. Negative for dental complaints.   Eyes: Negative for vision loss or change in visual acuity.  Respiratory: Negative for chest tightness and wheezing. Negative for DOE.   Cardiovascular: Negative for chest pain or palpitations. No decreased exercise tolerance Gastrointestinal: No change in bowel habit. No bloating or gas. No reflux or indigestion Genitourinary: Negative for urgency, frequency, flank pain and difficulty urinating.  Musculoskeletal: Negative for myalgias, back pain, arthralgias and gait problem.  Neurological: Negative for dizziness, tremors, weakness and headaches.  Hematological: Negative for adenopathy.  Psychiatric/Behavioral: Negative for behavioral problems and dysphoric mood.       Objective:   Physical Exam Filed Vitals:   02/28/13 1421  BP: 120/74  Pulse: 64  Temp: 97.5 F (36.4 C)  Resp: 20   Wt Readings from Last 3 Encounters:  02/28/13 150 lb 6.4 oz (68.221 kg)  02/15/12 150 lb 12 oz (68.38 kg)  06/02/11 151 lb (68.493 kg)   Gen'l:  Well nourished well developed white male in no acute distress  HEENT: Head: Normocephalic and atraumatic. Right Ear: External ear normal. EAC/TM nl. Left Ear: External ear normal.  EAC/TM nl. Nose: Nose normal. Mouth/Throat: Oropharynx is clear and moist. Dentition - native, in good repair. No buccal or palatal lesions. Posterior pharynx clear. Eyes: Conjunctivae and sclera clear. EOM intact. Pupils are equal, round, and reactive to light. Right eye exhibits no discharge. Left eye exhibits no discharge. Neck: Normal range of motion. Neck supple. No JVD present. No tracheal deviation present. No thyromegaly present.  Cardiovascular: Normal rate, regular rhythm, no gallop, no friction rub, no murmur heard.      Quiet precordium. 2+ radial and DP pulses . No carotid bruits Pulmonary/Chest: Effort normal. No respiratory distress  or increased WOB, no wheezes, no rales. No chest wall deformity or CVAT. Abdomen: Soft. Bowel sounds are normal in all quadrants. He exhibits no distension, no tenderness, no rebound or guarding, No heptosplenomegaly  Genitourinary:  deferred Musculoskeletal: Normal range of motion. He exhibits no edema and no tenderness.       Small and large joints without redness, synovial thickening or deformity. Full range of motion preserved about all small, median and large joints.  Lymphadenopathy:    He has no cervical or supraclavicular adenopathy.  Neurological: He is alert and oriented to person, place, and time. CN II-XII intact. DTRs 2+ and symmetrical biceps, radial and patellar tendons. Cerebellar function normal with no tremor, rigidity, normal gait and station.  Skin: Skin is warm and dry. No rash noted. No erythema.  Psychiatric: He has a normal mood and affect. His behavior is normal. Thought content normal.   Lab Results  Component Value Date   WBC 8.0 04/03/2011   HGB 15.8 04/03/2011   HCT 46.9 04/03/2011   PLT 153.0 04/03/2011   GLUCOSE 88 04/03/2011   CHOL 222* 04/03/2011    TRIG 32.0 04/03/2011   HDL 100.40 04/03/2011   LDLDIRECT 101.3 04/03/2011   ALT 16 04/03/2011   AST 19 04/03/2011   NA 140 04/03/2011   K 4.5 04/03/2011   CL 105 04/03/2011   CREATININE 1.4 04/03/2011   BUN 17 04/03/2011   CO2 27 04/03/2011   TSH 1.92 04/03/2011   PSA 2.03 04/03/2011           Assessment & Plan:

## 2013-02-28 NOTE — Patient Instructions (Addendum)
Thanks for coming to see me.  Your exam is normal, including the skin. You do seem to have a small patch on the right abdomen that looks like a fungal lesion. Get OTC lamisil and apply twice a day for 5 days to see if this clears up. There is a seborrheic keratosis on the right abdomen - the grey scaly lesion, that is benign.  You had lab in the fall and there is no need to repeat lab today. Immunizations are current. You have "aged out" of prostate and colon cancer screening.  Come back to see me in 1 year or sooner.   Please sign up for MyChart

## 2013-03-02 ENCOUNTER — Telehealth: Payer: Self-pay | Admitting: Gastroenterology

## 2013-03-02 NOTE — Assessment & Plan Note (Signed)
Interval medical history is benign. Physical exam is normal. Labs- prior labs reviewed and they were all normal with no indication to repeat them. He is current with colorectal cancer screening. He has had prostate biopsy for elevated PSA in the past that was negative. Immunizations are up to date. He is active and takes good care of himself.  In summary - a very nice man who appears medically stable and to be doing well. He is encouraged to maintain his level of activity. He will return as needed or in 1 year.

## 2013-03-02 NOTE — Telephone Encounter (Signed)
ok 

## 2013-03-02 NOTE — Telephone Encounter (Signed)
Pt states he saw his PCP and discussed the recall date for his colon. Per Pt he was told by his PCP that he did not need the colon due to his age so he does not want to schedule an OV either. Dr. Arlyce Dice notified.

## 2013-04-02 ENCOUNTER — Other Ambulatory Visit: Payer: Self-pay | Admitting: Internal Medicine

## 2013-06-15 ENCOUNTER — Other Ambulatory Visit: Payer: Self-pay | Admitting: Internal Medicine

## 2013-10-02 LAB — BASIC METABOLIC PANEL
BUN: 19 mg/dL (ref 4–21)
Creatinine: 1.3 mg/dL (ref 0.6–1.3)
POTASSIUM: 4.8 mmol/L (ref 3.4–5.3)
Sodium: 141 mmol/L (ref 137–147)

## 2013-10-02 LAB — HEPATIC FUNCTION PANEL
ALT: 21 U/L (ref 10–40)
AST: 12 U/L — AB (ref 14–40)
Alkaline Phosphatase: 84 U/L (ref 25–125)
BILIRUBIN DIRECT: 0.3 mg/dL (ref 0.01–0.4)

## 2013-10-02 LAB — CBC AND DIFFERENTIAL
HCT: 47 % (ref 41–53)
HEMOGLOBIN: 15.8 g/dL (ref 13.5–17.5)
Platelets: 158 10*3/uL (ref 150–399)
WBC: 6.4 10^3/mL

## 2013-10-02 LAB — LIPID PANEL
Cholesterol: 180 mg/dL (ref 0–200)
HDL: 108 mg/dL — AB (ref 35–70)
LDL Cholesterol: 65 mg/dL
Triglycerides: 35 mg/dL — AB (ref 40–160)

## 2013-10-02 LAB — TSH: TSH: 1.99 u[IU]/mL (ref 0.41–5.90)

## 2013-10-05 ENCOUNTER — Other Ambulatory Visit: Payer: Self-pay

## 2014-03-01 ENCOUNTER — Encounter: Payer: Self-pay | Admitting: Internal Medicine

## 2014-03-01 ENCOUNTER — Ambulatory Visit (INDEPENDENT_AMBULATORY_CARE_PROVIDER_SITE_OTHER): Payer: Medicare Other | Admitting: Internal Medicine

## 2014-03-01 VITALS — BP 120/68 | HR 72 | Temp 98.5°F | Resp 16 | Ht 67.0 in | Wt 149.0 lb

## 2014-03-01 DIAGNOSIS — R972 Elevated prostate specific antigen [PSA]: Secondary | ICD-10-CM | POA: Insufficient documentation

## 2014-03-01 DIAGNOSIS — E785 Hyperlipidemia, unspecified: Secondary | ICD-10-CM | POA: Insufficient documentation

## 2014-03-01 DIAGNOSIS — Z0000 Encounter for general adult medical examination without abnormal findings: Secondary | ICD-10-CM

## 2014-03-01 DIAGNOSIS — K219 Gastro-esophageal reflux disease without esophagitis: Secondary | ICD-10-CM

## 2014-03-01 LAB — FECAL OCCULT BLOOD, GUAIAC: FECAL OCCULT BLD: NEGATIVE

## 2014-03-01 NOTE — Progress Notes (Signed)
Subjective:    Patient ID: Nicholas Frye, male    DOB: 1930-11-20, 80 y.o.   MRN: [id number]  Gastrophageal Reflux He complains of heartburn. He reports no abdominal pain, no belching, no chest pain, no choking, no coughing, no dysphagia, no early satiety, no globus sensation, no hoarse voice, no nausea, no sore throat, no stridor, no tooth decay, no water brash or no wheezing. This is a chronic problem. The current episode started more than 1 year ago. The problem occurs occasionally. The problem has been gradually improving. The heartburn duration is less than a minute. The heartburn is located in the substernum. The heartburn is of mild intensity. The heartburn does not wake him from sleep. The heartburn does not limit his activity. The heartburn doesn't change with position. Nothing aggravates the symptoms. Pertinent negatives include no anemia, fatigue, melena, muscle weakness, orthopnea or weight loss. He has tried a PPI for the symptoms. The treatment provided significant relief. Past procedures do not include an abdominal ultrasound, an EGD, esophageal manometry, esophageal pH monitoring, H. pylori antibody titer or a UGI.      Review of Systems  Constitutional: Negative.  Negative for fever, chills, weight loss, diaphoresis, appetite change and fatigue.  HENT: Negative.  Negative for hoarse voice and sore throat.   Eyes: Negative.   Respiratory: Negative.  Negative for cough, choking, chest tightness, shortness of breath, wheezing and stridor.   Cardiovascular: Negative.  Negative for chest pain, palpitations and leg swelling.  Gastrointestinal: Positive for heartburn. Negative for dysphagia, nausea, vomiting, abdominal pain, diarrhea, constipation, blood in stool and melena.  Endocrine: Negative.   Genitourinary: Negative.  Negative for dysuria, urgency, frequency, hematuria, flank pain, decreased urine volume, enuresis and difficulty urinating.  Musculoskeletal: Negative.   Negative for arthralgias, back pain, muscle weakness, myalgias and neck pain.  Skin: Negative.   Allergic/Immunologic: Negative.   Neurological: Negative.   Hematological: Negative.  Negative for adenopathy. Does not bruise/bleed easily.  Psychiatric/Behavioral: Negative.        Objective:   Physical Exam  Vitals reviewed. Constitutional: He is oriented to person, place, and time. He appears well-developed and well-nourished. No distress.  HENT:  Head: Normocephalic and atraumatic.  Mouth/Throat: Oropharynx is clear and moist. No oropharyngeal exudate.  Eyes: Conjunctivae are normal. Right eye exhibits no discharge. Left eye exhibits no discharge. No scleral icterus.  Neck: Normal range of motion. Neck supple. No JVD present. No tracheal deviation present. No thyromegaly present.  Cardiovascular: Normal rate, regular rhythm and intact distal pulses.  Exam reveals no gallop and no friction rub.   No murmur heard. Pulmonary/Chest: Effort normal and breath sounds normal. No stridor. No respiratory distress. He has no wheezes. He has no rales. He exhibits no tenderness.  Abdominal: Soft. Bowel sounds are normal. He exhibits no distension and no mass. There is no tenderness. There is no rebound and no guarding. Hernia confirmed negative in the right inguinal area and confirmed negative in the left inguinal area.  Genitourinary: Rectum normal and penis normal. Rectal exam shows no external hemorrhoid, no internal hemorrhoid, no fissure, no mass, no tenderness and anal tone normal. Guaiac negative stool. Prostate is enlarged (1-2 + BPH with bilateral nodularity). Prostate is not tender. Right testis shows no mass, no swelling and no tenderness. Right testis is descended. Left testis shows no mass, no swelling and no tenderness. Left testis is descended. Circumcised. No penile erythema or penile tenderness. No discharge found.  Musculoskeletal: Normal range of motion. He  exhibits no edema and no  tenderness.  Lymphadenopathy:    He has no cervical adenopathy.       Right: No inguinal adenopathy present.       Left: No inguinal adenopathy present.  Neurological: He is oriented to person, place, and time.  Skin: Skin is warm and dry. No rash noted. He is not diaphoretic. No erythema. No pallor.  Psychiatric: He has a normal mood and affect. His behavior is normal. Judgment and thought content normal.      Lab Results  Component Value Date   WBC 8.0 04/03/2011   HGB 15.8 04/03/2011   HCT 46.9 04/03/2011   PLT 153.0 04/03/2011   GLUCOSE 88 04/03/2011   CHOL 222* 04/03/2011   TRIG 32.0 04/03/2011   HDL 100.40 04/03/2011   LDLDIRECT 101.3 04/03/2011   ALT 16 04/03/2011   AST 19 04/03/2011   NA 140 04/03/2011   K 4.5 04/03/2011   CL 105 04/03/2011   CREATININE 1.4 04/03/2011   BUN 17 04/03/2011   CO2 27 04/03/2011   TSH 1.92 04/03/2011   PSA 2.03 04/03/2011      Assessment & Plan:

## 2014-03-01 NOTE — Assessment & Plan Note (Signed)
I will check his FLP today 

## 2014-03-01 NOTE — Assessment & Plan Note (Signed)
He is doing well on the PPI 

## 2014-03-01 NOTE — Progress Notes (Signed)
Pre visit review using our clinic review tool, if applicable. No additional management support is needed unless otherwise documented below in the visit note. 

## 2014-03-01 NOTE — Assessment & Plan Note (Signed)

## 2014-03-01 NOTE — Assessment & Plan Note (Signed)
I will recheck his PSA today 

## 2014-03-01 NOTE — Patient Instructions (Signed)
Health Maintenance, Males A healthy lifestyle and preventative care can promote health and wellness.  Maintain regular health, dental, and eye exams.  Eat a healthy diet. Foods like vegetables, fruits, whole grains, low-fat dairy products, and lean protein foods contain the nutrients you need and are low in calories. Decrease your intake of foods high in solid fats, added sugars, and salt. Get information about a proper diet from your health care provider, if necessary.  Regular physical exercise is one of the most important things you can do for your health. Most adults should get at least 150 minutes of moderate-intensity exercise (any activity that increases your heart rate and causes you to sweat) each week. In addition, most adults need muscle-strengthening exercises on 2 or more days a week.   Maintain a healthy weight. The body mass index (BMI) is a screening tool to identify possible weight problems. It provides an estimate of body fat based on height and weight. Your health care provider can find your BMI and can help you achieve or maintain a healthy weight. For males 20 years and older:  A BMI below 18.5 is considered underweight.  A BMI of 18.5 to 24.9 is normal.  A BMI of 25 to 29.9 is considered overweight.  A BMI of 30 and above is considered obese.  Maintain normal blood lipids and cholesterol by exercising and minimizing your intake of saturated fat. Eat a balanced diet with plenty of fruits and vegetables. Blood tests for lipids and cholesterol should begin at age 20 and be repeated every 5 years. If your lipid or cholesterol levels are high, you are over 50, or you are at high risk for heart disease, you may need your cholesterol levels checked more frequently.Ongoing high lipid and cholesterol levels should be treated with medicines, if diet and exercise are not working.  If you smoke, find out from your health care provider how to quit. If you do not use tobacco, do not  start.  Lung cancer screening is recommended for adults aged 55 80 years who are at high risk for developing lung cancer because of a history of smoking. A yearly low-dose CT scan of the lungs is recommended for people who have at least a 30-pack-year history of smoking and are a current smoker or have quit within the past 15 years. A pack year of smoking is smoking an average of 1 pack of cigarettes a day for 1 year (for example, a 30-pack-year history of smoking could mean smoking 1 pack a day for 30 years or 2 packs a day for 15 years). Yearly screening should continue until the smoker has stopped smoking for at least 15 years. Yearly screening should be stopped for people who develop a health problem that would prevent them from having lung cancer treatment.  If you choose to drink alcohol, do not have more than 2 drinks per day. One drink is considered to be 12 oz (360 mL) of beer, 5 oz (150 mL) of wine, or 1.5 oz (45 mL) of liquor.  Avoid use of street drugs. Do not share needles with anyone. Ask for help if you need support or instructions about stopping the use of drugs.  High blood pressure causes heart disease and increases the risk of stroke. Blood pressure should be checked at least every 1 2 years. Ongoing high blood pressure should be treated with medicines if weight loss and exercise are not effective.  If you are 45 79 years old, ask your health   care provider if you should take aspirin to prevent heart disease.  Diabetes screening involves taking a blood sample to check your fasting blood sugar level. This should be done once every 3 years after age 45, if you are at a normal weight and without risk factors for diabetes. Testing should be considered at a younger age or be carried out more frequently if you are overweight and have at least 1 risk factor for diabetes.  Colorectal cancer can be detected and often prevented. Most routine colorectal cancer screening begins at the age of 50  and continues through age 75. However, your health care provider may recommend screening at an earlier age if you have risk factors for colon cancer. On a yearly basis, your health care provider may provide home test kits to check for hidden blood in the stool. A small camera at the end of a tube may be used to directly examine the colon (sigmoidoscopy or colonoscopy) to detect the earliest forms of colorectal cancer. Talk to your health care provider about this at age 50, when routine screening begins. A direct exam of the colon should be repeated every 5 10 years through age 75, unless early forms of pre-cancerous polyps or small growths are found.  People who are at an increased risk for hepatitis B should be screened for this virus. You are considered at high risk for hepatitis B if:  You were born in a country where hepatitis B occurs often. Talk with your health care provider about which countries are considered high-risk.  Your parents were born in a high-risk country and you have not received a shot to protect against hepatitis B (hepatitis B vaccine).  You have HIV or AIDS.  You use needles to inject street drugs.  You live with, or have sex with, someone who has hepatitis B.  You are a man who has sex with other men (MSM).  You get hemodialysis treatment.  You take certain medicines for conditions like cancer, organ transplantation, and autoimmune conditions.  Hepatitis C blood testing is recommended for all people born from 1945 through 1965 and any individual with known risk factors for hepatitis C.  Healthy men should no longer receive prostate-specific antigen (PSA) blood tests as part of routine cancer screening. Talk to your health care provider about prostate cancer screening.  Testicular cancer screening is not recommended for adolescents or adult males who have no symptoms. Screening includes self-exam, a health care provider exam, and other screening tests. Consult with  your health care provider about any symptoms you have or any concerns you have about testicular cancer.  Practice safe sex. Use condoms and avoid high-risk sexual practices to reduce the spread of sexually transmitted infections (STIs).  Use sunscreen. Apply sunscreen liberally and repeatedly throughout the day. You should seek shade when your shadow is shorter than you. Protect yourself by wearing long sleeves, pants, a wide-brimmed hat, and sunglasses year round, whenever you are outdoors.  Tell your health care provider of new moles or changes in moles, especially if there is a change in shape or color. Also tell your provider if a mole is larger than the size of a pencil eraser.  A one-time screening for abdominal aortic aneurysm (AAA) and surgical repair of large AAAs by ultrasound is recommended for men aged 65 75 years who are current or former smokers.  Stay current with your vaccines (immunizations). Document Released: 05/14/2008 Document Revised: 09/06/2013 Document Reviewed: 04/13/2011 [employer] Patient Information 2014 [employer]   Gastroesophageal Reflux Disease, Adult Gastroesophageal reflux disease (GERD) happens when acid from your stomach flows up into the esophagus. When acid comes in contact with the esophagus, the acid causes soreness (inflammation) in the esophagus. Over time, GERD may create small holes (ulcers) in the lining of the esophagus. CAUSES   Increased body weight. This puts pressure on the stomach, making acid rise from the stomach into the esophagus.  Smoking. This increases acid production in the stomach.  Drinking alcohol. This causes decreased pressure in the lower esophageal sphincter (valve or ring of muscle between the esophagus and stomach), allowing acid from the stomach into the esophagus.  Late evening meals and a full stomach. This increases pressure and acid production in the stomach.  A malformed lower esophageal sphincter. Sometimes, no  cause is found. SYMPTOMS   Burning pain in the lower part of the mid-chest behind the breastbone and in the mid-stomach area. This may occur twice a week or more often.  Trouble swallowing.  Sore throat.  Dry cough.  Asthma-like symptoms including chest tightness, shortness of breath, or wheezing. DIAGNOSIS  Your caregiver may be able to diagnose GERD based on your symptoms. In some cases, X-rays and other tests may be done to check for complications or to check the condition of your stomach and esophagus. TREATMENT  Your caregiver may recommend over-the-counter or prescription medicines to help decrease acid production. Ask your caregiver before starting or adding any new medicines.  HOME CARE INSTRUCTIONS   Change the factors that you can control. Ask your caregiver for guidance concerning weight loss, quitting smoking, and alcohol consumption.  Avoid foods and drinks that make your symptoms worse, such as:  Caffeine or alcoholic drinks.  Chocolate.  Peppermint or mint flavorings.  Garlic and onions.  Spicy foods.  Citrus fruits, such as oranges, lemons, or limes.  Tomato-based foods such as sauce, chili, salsa, and pizza.  Fried and fatty foods.  Avoid lying down for the 3 hours prior to your bedtime or prior to taking a nap.  Eat small, frequent meals instead of large meals.  Wear loose-fitting clothing. Do not wear anything tight around your waist that causes pressure on your stomach.  Raise the head of your bed 6 to 8 inches with wood blocks to help you sleep. Extra pillows will not help.  Only take over-the-counter or prescription medicines for pain, discomfort, or fever as directed by your caregiver.  Do not take aspirin, ibuprofen, or other nonsteroidal anti-inflammatory drugs (NSAIDs). SEEK IMMEDIATE MEDICAL CARE IF:   You have pain in your arms, neck, jaw, teeth, or back.  Your pain increases or changes in intensity or duration.  You develop nausea,  vomiting, or sweating (diaphoresis).  You develop shortness of breath, or you faint.  Your vomit is green, yellow, black, or looks like coffee grounds or blood.  Your stool is red, bloody, or black. These symptoms could be signs of other problems, such as heart disease, gastric bleeding, or esophageal bleeding. MAKE SURE YOU:   Understand these instructions.  Will watch your condition.  Will get help right away if you are not doing well or get worse. Document Released: 08/26/2005 Document Revised: 02/08/2012 Document Reviewed: 06/05/2011 [facility] Patient Information 2014 [place], [place].

## 2014-05-11 ENCOUNTER — Other Ambulatory Visit: Payer: Self-pay | Admitting: *Deleted

## 2014-05-11 MED ORDER — EPINEPHRINE 0.3 MG/0.3ML IJ SOAJ: 0.3000 mg | Freq: Once | INTRAMUSCULAR | Status: AC

## 2014-06-20 ENCOUNTER — Other Ambulatory Visit: Payer: Self-pay

## 2014-06-20 MED ORDER — OMEPRAZOLE 40 MG PO CPDR: DELAYED_RELEASE_CAPSULE | ORAL | Status: DC

## 2014-06-27 ENCOUNTER — Encounter: Payer: Self-pay | Admitting: Gastroenterology

## 2014-12-10 ENCOUNTER — Other Ambulatory Visit: Payer: Self-pay

## 2014-12-10 MED ORDER — OMEPRAZOLE 40 MG PO CPDR: DELAYED_RELEASE_CAPSULE | ORAL | Status: DC

## 2015-03-04 ENCOUNTER — Ambulatory Visit (INDEPENDENT_AMBULATORY_CARE_PROVIDER_SITE_OTHER): Payer: Medicare Other | Admitting: Internal Medicine

## 2015-03-04 ENCOUNTER — Encounter: Payer: Self-pay | Admitting: Internal Medicine

## 2015-03-04 ENCOUNTER — Encounter: Payer: Medicare Other | Admitting: Certified Registered Nurse Anesthetist

## 2015-03-04 VITALS — BP 118/80 | HR 64 | Temp 98.3°F | Resp 16 | Ht 67.0 in | Wt 149.2 lb

## 2015-03-04 VITALS — BP 118/18 | HR 63 | Temp 98.3°F | Ht 67.0 in | Wt 149.2 lb

## 2015-03-04 DIAGNOSIS — K219 Gastro-esophageal reflux disease without esophagitis: Secondary | ICD-10-CM

## 2015-03-04 DIAGNOSIS — I499 Cardiac arrhythmia, unspecified: Secondary | ICD-10-CM

## 2015-03-04 DIAGNOSIS — Z0000 Encounter for general adult medical examination without abnormal findings: Secondary | ICD-10-CM

## 2015-03-04 DIAGNOSIS — E785 Hyperlipidemia, unspecified: Secondary | ICD-10-CM

## 2015-03-04 NOTE — Progress Notes (Signed)
Pre visit review using our clinic review tool, if applicable. No additional management support is needed unless otherwise documented below in the visit note. 

## 2015-03-04 NOTE — Progress Notes (Signed)
Subjective:   Nicholas Frye is a 82 y.o. male who presents for Medicare Annual/Subsequent preventive examination.  Review of Systems:   Cardiac Risk Factors include: advanced age (>67men, >47 women) (works 3 days a week; not sedentary)  No cardiac risk determined; Brother had heart disease but high risk behavior noted; patient BMI normal; Does complain of Ua freq at hs States he rec'd prevnar at [place]; will email Confirmation.     Objective:    Vitals: BP 118/18 mmHg  Pulse 63  Temp(Src) 98.3 F (36.8 C) (Oral)  Ht 5\' 7"  (1.702 m)  Wt 149 lb 4 oz (67.699 kg)  BMI 23.37 kg/m2  SpO2 95%  Tobacco History  Smoking status  . Never Smoker   Smokeless tobacco  . Never Used     Counseling given: Yes   Past Medical History  Diagnosis Date  . Lumbago   . Unspecified tinnitus     resolved  . 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 Elevated prostate specific antigen (PSA)     aged out  . Personal history of colonic polyps   . GERD (gastroesophageal reflux disease)    Past Surgical History  Procedure Laterality Date  . Tonsillectomy    . Punch biopsy of a lesion on forehead    . Transrectal prostate biopsies that were negative    . Cataract extraction w/ intraocular lens  implant, bilateral    . Cataract extraction Bilateral 11/30/1998   Family History  Problem Relation Age of Onset  . Peripheral vascular disease Mother   . Other Mother     bilateral Leg amputation  . Coronary artery disease Father   . Heart attack Father   . Heart disease Father     heart failure/ acute MI  . Peripheral vascular disease Sister   . Coronary artery disease Brother     valve replaced  . Heart disease Brother     MI x2, CABG, AVR  . Cancer Neg Hx     colon or prostate  . Diabetes Neg Hx    History  Sexual Activity  . Sexual Activity: No    Outpatient Encounter Prescriptions as of 03/04/2015  Medication Sig  . aspirin 81 MG tablet Take 81 mg by  mouth daily.    Marland Kitchen EPINEPHrine (EPIPEN 2-PAK) 0.3 mg/0.3 mL DEVI Use as Directed.  Marland Kitchen EPINEPHrine (EPIPEN) 0.3 mg/0.3 mL IJ SOAJ injection Inject 0.3 mLs (0.3 mg total) into the muscle once.  Marland Kitchen omeprazole (PRILOSEC) 40 MG capsule TAKE 1 CAPSULE EVERY MORNING (Patient taking differently: every other day. TAKE 1 CAPSULE EVERY MORNING)    Activities of Daily Living In your present state of health, do you have any difficulty performing the following activities: 03/04/2015  Is the patient deaf or have difficulty hearing? Y  Hearing N  Vision N  Difficulty concentrating or making decisions N  Walking or climbing stairs? N  Doing errands, shopping? N  Using the Toilet? N  In the past six months, have you accidently leaked urine? N  Do you have problems with loss of bowel control? N  Managing your Medications? N  Managing your Finances? N  Housekeeping or managing your Housekeeping? N    Patient Care Team: Janith Lima, MD as PCP - General (Internal Medicine) Jenel Lucks. Clydene Laming, MD (Ophthalmology)   Assessment:     Exercise Activities and Dietary recommendations Current Exercise Habits:: Structured exercise class, Type of exercise:  Other - see comments (yard work, go to [employer] wk/ swim; about 35 minutes), Time (Minutes): 30, Frequency (Times/Week): 1, Weekly Exercise (Minutes/Week): 30, Intensity: Moderate  Goals    None    Patient goal is to maintain health; feels good working in yard, job x 3 days as well as swimming x 1 day a week. Fall Risk Fall Risk  03/04/2015 03/01/2014 03/01/2014  Falls in the past year? No No No  Risk for fall due to : Other (Comment) - -  Risk for fall due to (comments): no risk identified - -   Depression Screen PHQ 2/9 Scores 03/04/2015 03/01/2014 03/01/2014  PHQ - 2 Score 0 0 0    Cognitive Testing MMSE - Mini Mental State Exam 03/04/2015  Orientation to time 5  Orientation to Place 5  Registration 3  Attention/ Calculation 5  Recall 2  Language- name 2 objects 2    Language- repeat 1  Language- follow 3 step command 3  Language- read & follow direction 1  Write a sentence 1  Copy design 1  Total score 29    Immunization History  Administered Date(s) Administered  . Pneumococcal Polysaccharide-23 11/30/2002  . Td 11/30/2002, 09/14/2013  . Zoster 02/28/2006   Screening Tests Health Maintenance  Topic Date Due  . PNA vac Low Risk Adult (2 of 2 - PCV13) 12/01/2003  . COLONOSCOPY  03/11/2013  . INFLUENZA VACCINE  07/01/2015  . TETANUS/TDAP  09/15/2023  . ZOSTAVAX  Completed      Plan:    During the course of the visit the patient was educated and counseled about the following appropriate screening and preventive services:   Vaccines to include Pneumoccal, Influenza, Hepatitis B, Td, Zostavax, HCV  Electrocardiogram; no chest pain; sob  Cardiovascular Disease  Colorectal cancer screening  Diabetes screening  Prostate Cancer Screening  Glaucoma screening  Nutrition counseling   Smoking cessation counseling  Patient Instructions (the written plan) was given to the patient.    Wynetta Fines, RN  03/04/2015

## 2015-03-04 NOTE — Patient Instructions (Signed)

## 2015-03-04 NOTE — Assessment & Plan Note (Addendum)

## 2015-03-04 NOTE — Progress Notes (Signed)
This encounter was created in error - please disregard.

## 2015-03-05 DIAGNOSIS — I499 Cardiac arrhythmia, unspecified: Secondary | ICD-10-CM | POA: Insufficient documentation

## 2015-03-05 NOTE — Assessment & Plan Note (Signed)
FLP today He tells me that he does not want to take a statin

## 2015-03-05 NOTE — Progress Notes (Signed)
Subjective:    Patient ID: Nicholas Frye, male    DOB: Mar 27, 1930, 81 y.o.   MRN: [id number]  HPI Comments: He came in today for a physical but he also tells me that his insurance co. sent someone to his home for a check-up and hey told that he had some irregular heart beats that needed to be evaluated. He is not aware of any palpitations and tells me that he feels well.  Gastrophageal Reflux He reports no abdominal pain, no belching, no chest pain, no choking, no coughing, no dysphagia, no early satiety, no globus sensation, no heartburn, no hoarse voice, no nausea, no sore throat, no stridor, no tooth decay, no water brash or no wheezing. This is a chronic problem. The current episode started more than 1 year ago. The problem occurs rarely. Pertinent negatives include no anemia, fatigue, melena, muscle weakness, orthopnea or weight loss. He has tried a PPI for the symptoms. The treatment provided significant relief.      Review of Systems  Constitutional: Negative.  Negative for fever, chills, weight loss, diaphoresis, appetite change and fatigue.  HENT: Negative.  Negative for hoarse voice and sore throat.   Eyes: Negative.   Respiratory: Negative.  Negative for cough, choking, chest tightness, shortness of breath, wheezing and stridor.   Cardiovascular: Negative.  Negative for chest pain, palpitations and leg swelling.  Gastrointestinal: Negative.  Negative for heartburn, dysphagia, nausea, abdominal pain, constipation, blood in stool and melena.  Endocrine: Negative.   Genitourinary: Negative.   Musculoskeletal: Negative.  Negative for muscle weakness.  Skin: Negative.   Allergic/Immunologic: Negative.   Neurological: Negative.   Hematological: Negative.  Negative for adenopathy. Does not bruise/bleed easily.  Psychiatric/Behavioral: Negative.        Objective:   Physical Exam  Constitutional: He is oriented to person, place, and time. He appears well-developed and  well-nourished. No distress.  HENT:  Head: Normocephalic and atraumatic.  Mouth/Throat: Oropharynx is clear and moist. No oropharyngeal exudate.  Eyes: Conjunctivae are normal. Right eye exhibits no discharge. Left eye exhibits no discharge. No scleral icterus.  Neck: Normal range of motion. Neck supple. No JVD present. No tracheal deviation present. No thyromegaly present.  Cardiovascular: Normal rate, regular rhythm, normal heart sounds and intact distal pulses.  Exam reveals no gallop and no friction rub.   No murmur heard. Pulmonary/Chest: Effort normal and breath sounds normal. No stridor. No respiratory distress. He has no wheezes. He has no rales. He exhibits no tenderness.  Abdominal: Soft. Bowel sounds are normal. He exhibits no distension and no mass. There is no tenderness. There is no rebound and no guarding.  Genitourinary:  He was not willing to undress for a GU or rectal exam  Musculoskeletal: Normal range of motion. He exhibits no edema or tenderness.  Lymphadenopathy:    He has no cervical adenopathy.  Neurological: He is oriented to person, place, and time.  Skin: Skin is warm and dry. No rash noted. He is not diaphoretic. No erythema. No pallor.  Psychiatric: He has a normal mood and affect. His behavior is normal. Judgment and thought content normal.  Vitals reviewed.     Lab Results  Component Value Date   WBC 6.4 10/02/2013   HGB 15.8 10/02/2013   HCT 47 10/02/2013   PLT 158 10/02/2013   GLUCOSE 88 04/03/2011   CHOL 180 10/02/2013   TRIG 35* 10/02/2013   HDL 108* 10/02/2013   LDLDIRECT 101.3 04/03/2011   LDLCALC 65 10/02/2013  ALT 21 10/02/2013   AST 12* 10/02/2013   NA 141 10/02/2013   K 4.8 10/02/2013   CL 105 04/03/2011   CREATININE 1.3 10/02/2013   BUN 19 10/02/2013   CO2 27 04/03/2011   TSH 1.99 10/02/2013   PSA 2.03 04/03/2011      Assessment & Plan:

## 2015-03-05 NOTE — Assessment & Plan Note (Signed)
He is doing well on the PPI

## 2015-03-05 NOTE — Assessment & Plan Note (Signed)
His EKG shows one PAC, the rest of the EKG is WNL This is benign and does not require any further intervention

## 2015-03-06 ENCOUNTER — Telehealth: Payer: Self-pay

## 2015-03-06 NOTE — Telephone Encounter (Signed)
Fup regarding status of Prevnar  Email sent by the patient confirming he prefers primary care be managed by Dr. Ronnald Ramp, but does see [place] doctor to stay connected to the [place] system Dr. At [facility] is [employer] at the [place] location. States he had pneumonia vaccination this past fall.  Call to [facility];  [place], [place] Phone: [phone] Per [facility], patient had high does flu shot, as well as Prevnar 13 on Oct 08, 2014.

## 2015-03-08 ENCOUNTER — Other Ambulatory Visit (INDEPENDENT_AMBULATORY_CARE_PROVIDER_SITE_OTHER): Payer: Medicare Other

## 2015-03-08 DIAGNOSIS — E785 Hyperlipidemia, unspecified: Secondary | ICD-10-CM

## 2015-03-08 DIAGNOSIS — K219 Gastro-esophageal reflux disease without esophagitis: Secondary | ICD-10-CM

## 2015-03-08 LAB — CBC WITH DIFFERENTIAL/PLATELET
Basophils Absolute: 0 10*3/uL (ref 0.0–0.1)
Basophils Relative: 0.4 % (ref 0.0–3.0)
EOS PCT: 4.6 % (ref 0.0–5.0)
Eosinophils Absolute: 0.3 10*3/uL (ref 0.0–0.7)
HEMATOCRIT: 46.1 % (ref 39.0–52.0)
Hemoglobin: 15.4 g/dL (ref 13.0–17.0)
LYMPHS PCT: 28.6 % (ref 12.0–46.0)
Lymphs Abs: 2.2 10*3/uL (ref 0.7–4.0)
MCHC: 33.5 g/dL (ref 30.0–36.0)
MCV: 90.9 fl (ref 78.0–100.0)
Monocytes Absolute: 0.7 10*3/uL (ref 0.1–1.0)
Monocytes Relative: 8.9 % (ref 3.0–12.0)
NEUTROS ABS: 4.4 10*3/uL (ref 1.4–7.7)
NEUTROS PCT: 57.5 % (ref 43.0–77.0)
PLATELETS: 158 10*3/uL (ref 150.0–400.0)
RBC: 5.07 Mil/uL (ref 4.22–5.81)
RDW: 13.5 % (ref 11.5–15.5)
WBC: 7.6 10*3/uL (ref 4.0–10.5)

## 2015-03-08 LAB — COMPREHENSIVE METABOLIC PANEL
ALBUMIN: 4 g/dL (ref 3.5–5.2)
ALT: 12 U/L (ref 0–53)
AST: 15 U/L (ref 0–37)
Alkaline Phosphatase: 68 U/L (ref 39–117)
BUN: 20 mg/dL (ref 6–23)
CALCIUM: 9.8 mg/dL (ref 8.4–10.5)
CHLORIDE: 109 meq/L (ref 96–112)
CO2: 27 meq/L (ref 19–32)
CREATININE: 2.08 mg/dL — AB (ref 0.40–1.50)
GFR: 32.62 mL/min — ABNORMAL LOW (ref >60.00)
GLUCOSE: 91 mg/dL (ref 70–99)
Potassium: 4.7 mEq/L (ref 3.5–5.1)
Sodium: 140 mEq/L (ref 135–145)
Total Bilirubin: 0.6 mg/dL (ref 0.2–1.2)
Total Protein: 7 g/dL (ref 6.0–8.3)

## 2015-03-08 LAB — LIPID PANEL
Cholesterol: 195 mg/dL (ref 0–200)
HDL: 91.9 mg/dL (ref >39.00)
LDL Cholesterol: 94 mg/dL (ref 0–99)
NONHDL: 103.1
Total CHOL/HDL Ratio: 2
Triglycerides: 47 mg/dL (ref 0.0–149.0)
VLDL: 9.4 mg/dL (ref 0.0–40.0)

## 2015-03-08 LAB — TSH: TSH: 3.25 u[IU]/mL (ref 0.35–4.50)

## 2015-03-09 ENCOUNTER — Encounter: Payer: Self-pay | Admitting: Internal Medicine

## 2015-03-29 ENCOUNTER — Telehealth: Payer: Self-pay | Admitting: Internal Medicine

## 2015-03-29 DIAGNOSIS — E785 Hyperlipidemia, unspecified: Secondary | ICD-10-CM

## 2015-03-29 NOTE — Telephone Encounter (Signed)
Left message advising that lab orders are entered, it is not necessary for office visit or to make appt with lab, i gave lab hours and advised patient can come any day during those hours, any other questions to call

## 2015-03-29 NOTE — Telephone Encounter (Signed)
Patient received labs from Dr. Ronnald Ramp through My Chart stating he would like to test kidney function in a few weeks.  Patient states it has been three weeks.  He would like to know if he needs to make an appointment or if he needs to go to lab.

## 2015-04-01 ENCOUNTER — Other Ambulatory Visit: Payer: Medicare Other

## 2015-04-01 DIAGNOSIS — E785 Hyperlipidemia, unspecified: Secondary | ICD-10-CM | POA: Diagnosis not present

## 2015-04-01 LAB — COMPLETE METABOLIC PANEL WITH GFR
ALT: 11 U/L (ref 0–53)
AST: 16 U/L (ref 0–37)
Albumin: 4 g/dL (ref 3.5–5.2)
Alkaline Phosphatase: 68 U/L (ref 39–117)
BUN: 15 mg/dL (ref 6–23)
CALCIUM: 9.1 mg/dL (ref 8.4–10.5)
CO2: 26 mEq/L (ref 19–32)
CREATININE: 1.34 mg/dL (ref 0.50–1.35)
Chloride: 105 mEq/L (ref 96–112)
GFR, Est African American: 57 mL/min — ABNORMAL LOW
GFR, Est Non African American: 49 mL/min — ABNORMAL LOW
Glucose, Bld: 91 mg/dL (ref 70–99)
Potassium: 4.2 mEq/L (ref 3.5–5.3)
Sodium: 139 mEq/L (ref 135–145)
TOTAL PROTEIN: 6.9 g/dL (ref 6.0–8.3)
Total Bilirubin: 0.9 mg/dL (ref 0.2–1.2)

## 2015-04-02 ENCOUNTER — Encounter: Payer: Self-pay | Admitting: Internal Medicine

## 2015-07-01 DIAGNOSIS — H903 Sensorineural hearing loss, bilateral: Secondary | ICD-10-CM | POA: Diagnosis not present

## 2015-07-08 ENCOUNTER — Ambulatory Visit (INDEPENDENT_AMBULATORY_CARE_PROVIDER_SITE_OTHER): Payer: Medicare Other | Admitting: Internal Medicine

## 2015-07-08 ENCOUNTER — Encounter: Payer: Self-pay | Admitting: Internal Medicine

## 2015-07-08 VITALS — BP 130/70 | HR 63 | Temp 98.5°F | Resp 16 | Ht 67.0 in

## 2015-07-08 DIAGNOSIS — H6120 Impacted cerumen, unspecified ear: Secondary | ICD-10-CM | POA: Diagnosis not present

## 2015-07-08 NOTE — Patient Instructions (Signed)
Cerumen Impaction °A cerumen impaction is when the wax in your ear forms a plug. This plug usually causes reduced hearing. Sometimes it also causes an earache or dizziness. Removing a cerumen impaction can be difficult and painful. The wax sticks to the ear canal. The canal is sensitive and bleeds easily. If you try to remove a heavy wax buildup with a cotton tipped swab, you may push it in further. °Irrigation with water, suction, and small ear curettes may be used to clear out the wax. If the impaction is fixed to the skin in the ear canal, ear drops may be needed for a few days to loosen the wax. People who build up a lot of wax frequently can use ear wax removal products available in your local drugstore. °SEEK MEDICAL CARE IF:  °You develop an earache, increased hearing loss, or marked dizziness. °Document Released: 12/24/2004 Document Revised: 02/08/2012 Document Reviewed: 02/13/2010 °ExitCare® Patient Information ©2015 [employer]. This information is not intended to replace advice given to you by your health care provider. Make sure you discuss any questions you have with your health care provider. ° °

## 2015-07-08 NOTE — Progress Notes (Signed)
Pre visit review using our clinic review tool, if applicable. No additional management support is needed unless otherwise documented below in the visit note. 

## 2015-07-09 ENCOUNTER — Encounter: Payer: Self-pay | Admitting: Internal Medicine

## 2015-07-09 DIAGNOSIS — H6120 Impacted cerumen, unspecified ear: Secondary | ICD-10-CM | POA: Insufficient documentation

## 2015-07-09 NOTE — Progress Notes (Signed)
Subjective:  Patient ID: Nicholas Frye, male    DOB: 08-05-1930  Age: 81 y.o. MRN: [id number]  CC: Hearing Problem   HPI Nicholas Frye presents for wax in his ear canals. He will be receiving new hearing aids soon and was told to get the wax cleaned out of his ears before the new hearing aids are placed. He does complain of loss of hearing.  Outpatient Prescriptions Prior to Visit  Medication Sig Dispense Refill  . aspirin 81 MG tablet Take 81 mg by mouth daily.      Marland Kitchen EPINEPHrine (EPIPEN) 0.3 mg/0.3 mL IJ SOAJ injection Inject 0.3 mLs (0.3 mg total) into the muscle once. 1 Device 3  . omeprazole (PRILOSEC) 40 MG capsule TAKE 1 CAPSULE EVERY MORNING (Patient taking differently: every other day. TAKE 1 CAPSULE EVERY MORNING) 90 capsule 3   No facility-administered medications prior to visit.    ROS Review of Systems  Constitutional: Negative.  Negative for fever, chills, diaphoresis, appetite change and fatigue.  HENT: Positive for hearing loss. Negative for ear discharge, ear pain, facial swelling, postnasal drip, rhinorrhea, sore throat and voice change.   Eyes: Negative.   Respiratory: Negative.   Cardiovascular: Negative.  Negative for chest pain, palpitations and leg swelling.  Gastrointestinal: Negative.  Negative for abdominal pain.  Endocrine: Negative.   Genitourinary: Negative.   Musculoskeletal: Negative.   Allergic/Immunologic: Negative.   Neurological: Negative.   Hematological: Negative.  Negative for adenopathy. Does not bruise/bleed easily.  Psychiatric/Behavioral: Negative.     Objective:  BP 130/70 mmHg  Pulse 63  Temp(Src) 98.5 F (36.9 C) (Oral)  Ht 5\' 7"  (1.702 m)  SpO2 97%  BP Readings from Last 3 Encounters:  07/08/15 130/70  03/04/15 118/80  03/04/15 118/18    Wt Readings from Last 3 Encounters:  03/04/15 149 lb 4 oz (67.699 kg)  03/04/15 149 lb 4 oz (67.699 kg)  03/01/14 149 lb (67.586 kg)    Physical Exam  Constitutional:  He is oriented to person, place, and time. No distress.  HENT:  Right Ear: Hearing, tympanic membrane and external ear normal. A foreign body (cerumen impaction) is present.  Left Ear: Hearing, tympanic membrane and external ear normal. A foreign body (cerumen impaction) is present.  Mouth/Throat: Oropharynx is clear and moist. No oropharyngeal exudate.  I put Colace solution in both of his external auditory canals. I then used for irrigation and ear pick to remove the cerumen from his canals. All of the wax was removed and he tolerated this well. Examination afterwards shows normal tympanic membranes and normal external auditory canals  Eyes: Conjunctivae are normal. Right eye exhibits no discharge. Left eye exhibits no discharge. No scleral icterus.  Neck: Normal range of motion. Neck supple. No JVD present. No tracheal deviation present. No thyromegaly present.  Cardiovascular: Normal rate, regular rhythm, normal heart sounds and intact distal pulses.  Exam reveals no gallop and no friction rub.   No murmur heard. Pulmonary/Chest: Effort normal and breath sounds normal. No stridor. No respiratory distress. He has no wheezes. He has no rales. He exhibits no tenderness.  Abdominal: Soft. Bowel sounds are normal. He exhibits no distension and no mass. There is no tenderness. There is no rebound and no guarding.  Musculoskeletal: Normal range of motion. He exhibits no edema or tenderness.  Lymphadenopathy:    He has no cervical adenopathy.  Neurological: He is oriented to person, place, and time.  Skin: Skin is warm and dry. No  rash noted. He is not diaphoretic. No erythema. No pallor.    Lab Results  Component Value Date   WBC 7.6 03/08/2015   HGB 15.4 03/08/2015   HCT 46.1 03/08/2015   PLT 158.0 03/08/2015   GLUCOSE 91 04/01/2015   CHOL 195 03/08/2015   TRIG 47.0 03/08/2015   HDL 91.90 03/08/2015   LDLDIRECT 101.3 04/03/2011   LDLCALC 94 03/08/2015   ALT 11 04/01/2015   AST 16  04/01/2015   NA 139 04/01/2015   K 4.2 04/01/2015   CL 105 04/01/2015   CREATININE 1.34 04/01/2015   BUN 15 04/01/2015   CO2 26 04/01/2015   TSH 3.25 03/08/2015   PSA 2.03 04/03/2011    Dg Chest 2 View  06/02/2011   *RADIOLOGY REPORT*  Clinical Data: History of injury from fall.  Injury to the left side of the chest.  CHEST - 2 VIEW  Comparison: 03/08/2007.  Findings: The cardiac silhouette is normal size and shape. Ectasia and nonaneurysmal calcification of the thoracic aorta are seen. No pleural abnormality is evident.  There is flattening of the diaphragm on lateral image with slight hyperinflation configuration.  There is an area of subsegmental atelectasis in the right costophrenic angle.  No pleural effusion is seen. Osteophytes and syndesmophytes are seen in the spine.  IMPRESSION: Flattening of the diaphragm on lateral image with slight hyperinflation configuration.  A small area of subsegmental atelectasis in right costophrenic angle region.  No pulmonary edema, pneumonia, or other acute process identified.  Original Report Authenticated By: Delane Ginger, M.D.   Assessment & Plan:   There are no diagnoses linked to this encounter. I am having Mr. Mckillop maintain his aspirin, EPINEPHrine, omeprazole, and calcium carbonate.  Meds ordered this encounter  Medications  . calcium carbonate (TUMS - DOSED IN MG ELEMENTAL CALCIUM) 500 MG chewable tablet    Sig: Chew 1 tablet by mouth daily.     Follow-up: Return if symptoms worsen or fail to improve.  [name], MD

## 2015-09-30 DIAGNOSIS — H524 Presbyopia: Secondary | ICD-10-CM | POA: Diagnosis not present

## 2015-09-30 DIAGNOSIS — Z961 Presence of intraocular lens: Secondary | ICD-10-CM | POA: Diagnosis not present

## 2015-09-30 DIAGNOSIS — H353111 Nonexudative age-related macular degeneration, right eye, early dry stage: Secondary | ICD-10-CM | POA: Diagnosis not present

## 2015-09-30 DIAGNOSIS — H353121 Nonexudative age-related macular degeneration, left eye, early dry stage: Secondary | ICD-10-CM | POA: Diagnosis not present

## 2015-11-04 DIAGNOSIS — H353121 Nonexudative age-related macular degeneration, left eye, early dry stage: Secondary | ICD-10-CM | POA: Diagnosis not present

## 2015-11-04 DIAGNOSIS — R94112 Abnormal visually evoked potential [VEP]: Secondary | ICD-10-CM | POA: Diagnosis not present

## 2015-11-04 DIAGNOSIS — H353111 Nonexudative age-related macular degeneration, right eye, early dry stage: Secondary | ICD-10-CM | POA: Diagnosis not present

## 2016-03-26 ENCOUNTER — Ambulatory Visit (INDEPENDENT_AMBULATORY_CARE_PROVIDER_SITE_OTHER): Payer: Medicare Other | Admitting: Internal Medicine

## 2016-03-26 ENCOUNTER — Encounter: Payer: Self-pay | Admitting: Internal Medicine

## 2016-03-26 ENCOUNTER — Other Ambulatory Visit (INDEPENDENT_AMBULATORY_CARE_PROVIDER_SITE_OTHER): Payer: Medicare Other

## 2016-03-26 VITALS — BP 140/70 | HR 67 | Temp 98.7°F | Resp 16 | Ht 67.0 in | Wt 149.0 lb

## 2016-03-26 DIAGNOSIS — I739 Peripheral vascular disease, unspecified: Secondary | ICD-10-CM

## 2016-03-26 DIAGNOSIS — K219 Gastro-esophageal reflux disease without esophagitis: Secondary | ICD-10-CM | POA: Diagnosis not present

## 2016-03-26 DIAGNOSIS — I499 Cardiac arrhythmia, unspecified: Secondary | ICD-10-CM

## 2016-03-26 DIAGNOSIS — E785 Hyperlipidemia, unspecified: Secondary | ICD-10-CM | POA: Diagnosis not present

## 2016-03-26 DIAGNOSIS — Z0000 Encounter for general adult medical examination without abnormal findings: Secondary | ICD-10-CM

## 2016-03-26 LAB — COMPREHENSIVE METABOLIC PANEL
ALK PHOS: 66 U/L (ref 39–117)
ALT: 12 U/L (ref 0–53)
AST: 15 U/L (ref 0–37)
Albumin: 4.1 g/dL (ref 3.5–5.2)
BUN: 18 mg/dL (ref 6–23)
CHLORIDE: 101 meq/L (ref 96–112)
CO2: 31 mEq/L (ref 19–32)
Calcium: 9.9 mg/dL (ref 8.4–10.5)
Creatinine, Ser: 1.45 mg/dL (ref 0.40–1.50)
GFR: 49.34 mL/min — AB (ref >60.00)
GLUCOSE: 93 mg/dL (ref 70–99)
POTASSIUM: 4.6 meq/L (ref 3.5–5.1)
SODIUM: 139 meq/L (ref 135–145)
TOTAL PROTEIN: 7.2 g/dL (ref 6.0–8.3)
Total Bilirubin: 0.8 mg/dL (ref 0.2–1.2)

## 2016-03-26 LAB — LIPID PANEL
CHOL/HDL RATIO: 2
Cholesterol: 190 mg/dL (ref 0–200)
HDL: 91.1 mg/dL (ref >39.00)
LDL CALC: 90 mg/dL (ref 0–99)
NONHDL: 98.84
Triglycerides: 43 mg/dL (ref 0.0–149.0)
VLDL: 8.6 mg/dL (ref 0.0–40.0)

## 2016-03-26 LAB — CBC WITH DIFFERENTIAL/PLATELET
BASOS PCT: 0.3 % (ref 0.0–3.0)
Basophils Absolute: 0 10*3/uL (ref 0.0–0.1)
EOS PCT: 2.2 % (ref 0.0–5.0)
Eosinophils Absolute: 0.2 10*3/uL (ref 0.0–0.7)
HCT: 47.5 % (ref 39.0–52.0)
HEMOGLOBIN: 16 g/dL (ref 13.0–17.0)
LYMPHS ABS: 2.1 10*3/uL (ref 0.7–4.0)
Lymphocytes Relative: 21.9 % (ref 12.0–46.0)
MCHC: 33.8 g/dL (ref 30.0–36.0)
MCV: 90.9 fl (ref 78.0–100.0)
MONOS PCT: 8 % (ref 3.0–12.0)
Monocytes Absolute: 0.8 10*3/uL (ref 0.1–1.0)
NEUTROS PCT: 67.6 % (ref 43.0–77.0)
Neutro Abs: 6.6 10*3/uL (ref 1.4–7.7)
Platelets: 163 10*3/uL (ref 150.0–400.0)
RBC: 5.22 Mil/uL (ref 4.22–5.81)
RDW: 13.5 % (ref 11.5–15.5)
WBC: 9.8 10*3/uL (ref 4.0–10.5)

## 2016-03-26 LAB — TSH: TSH: 2.21 u[IU]/mL (ref 0.35–4.50)

## 2016-03-26 NOTE — Progress Notes (Signed)
Subjective:  Patient ID: Nicholas Frye, male    DOB: [birth year]  Age: 82 y.o. MRN: [id number]  CC: Annual Exam and Gastroesophageal Reflux   HPI Nicholas Frye presents for a CPX -  He has decided to stop taking a proton pump inhibitor because he is concerned that it may damage his renal function. He tells me his heartburn is well-controlled with the occasional dose of Tums. He has had no odynophagia, dysphagia, loss of appetite, weight loss, or other concerning symptoms.  Outpatient Prescriptions Prior to Visit  Medication Sig Dispense Refill  . aspirin 81 MG tablet Take 81 mg by mouth daily.      . calcium carbonate (TUMS - DOSED IN MG ELEMENTAL CALCIUM) 500 MG chewable tablet Chew 1 tablet by mouth daily.    Marland Kitchen EPINEPHrine (EPIPEN) 0.3 mg/0.3 mL IJ SOAJ injection Inject 0.3 mLs (0.3 mg total) into the muscle once. (Patient not taking: Reported on 03/26/2016) 1 Device 3  . omeprazole (PRILOSEC) 40 MG capsule TAKE 1 CAPSULE EVERY MORNING (Patient not taking: Reported on 03/26/2016) 90 capsule 3   No facility-administered medications prior to visit.    ROS Review of Systems  Constitutional: Negative.  Negative for fever, diaphoresis, activity change, appetite change and fatigue.  HENT: Negative.  Negative for congestion, facial swelling, sinus pressure, trouble swallowing and voice change.   Eyes: Negative.   Respiratory: Negative.  Negative for cough, shortness of breath and stridor.   Cardiovascular: Negative.  Negative for chest pain, palpitations and leg swelling.  Gastrointestinal: Negative.  Negative for abdominal pain.  Endocrine: Negative.   Genitourinary: Negative.  Negative for dysuria, urgency, hematuria, flank pain and decreased urine volume.  Musculoskeletal: Negative.  Negative for myalgias, back pain, joint swelling and arthralgias.  Skin: Negative.  Negative for color change and rash.  Allergic/Immunologic: Negative.   Neurological: Negative.  Negative for  dizziness, tremors, syncope, light-headedness, numbness and headaches.  Hematological: Negative.  Negative for adenopathy. Does not bruise/bleed easily.  Psychiatric/Behavioral: Negative.     Objective:  BP 140/70 mmHg  Pulse 67  Temp(Src) 98.7 F (37.1 C) (Oral)  Resp 16  Ht 5\' 7"  (1.702 m)  Wt 149 lb (67.586 kg)  BMI 23.33 kg/m2  SpO2 95%  BP Readings from Last 3 Encounters:  03/26/16 140/70  07/08/15 130/70  03/04/15 118/80    Wt Readings from Last 3 Encounters:  03/26/16 149 lb (67.586 kg)  03/04/15 149 lb 4 oz (67.699 kg)  03/04/15 149 lb 4 oz (67.699 kg)    Physical Exam  Constitutional: He is oriented to person, place, and time.  HENT:  Mouth/Throat: Oropharynx is clear and moist. No oropharyngeal exudate.  Eyes: Conjunctivae are normal. Right eye exhibits no discharge. Left eye exhibits no discharge. No scleral icterus.  Neck: Normal range of motion. Neck supple. No JVD present. No tracheal deviation present. No thyromegaly present.  Cardiovascular: Normal rate, regular rhythm, S1 normal, S2 normal, normal heart sounds and intact distal pulses.   Occasional extrasystoles are present. Exam reveals no gallop and no friction rub.   No murmur heard. Pulses:      Carotid pulses are 1+ on the right side, and 1+ on the left side.      Radial pulses are 1+ on the right side, and 1+ on the left side.       Femoral pulses are 1+ on the right side, and 1+ on the left side.      Popliteal pulses are 1+  on the right side, and 1+ on the left side.       Dorsalis pedis pulses are 1+ on the right side, and 1+ on the left side.       Posterior tibial pulses are 1+ on the right side, and 1+ on the left side.  Pulmonary/Chest: Effort normal and breath sounds normal. No stridor. No respiratory distress. He has no wheezes. He has no rales. He exhibits no tenderness.  Abdominal: Soft. Bowel sounds are normal. He exhibits no distension and no mass. There is no tenderness. There is no  rebound and no guarding.  Genitourinary:  Genitourinary and rectal exam was deferred at the patient's request  Musculoskeletal: Normal range of motion. He exhibits no edema or tenderness.  Lymphadenopathy:    He has no cervical adenopathy.  Neurological: He is oriented to person, place, and time.  Skin: Skin is warm and dry. No rash noted. No erythema. No pallor.  Psychiatric: He has a normal mood and affect. His behavior is normal. Judgment and thought content normal.  Vitals reviewed.   Lab Results  Component Value Date   WBC 9.8 03/26/2016   HGB 16.0 03/26/2016   HCT 47.5 03/26/2016   PLT 163.0 03/26/2016   GLUCOSE 93 03/26/2016   CHOL 190 03/26/2016   TRIG 43.0 03/26/2016   HDL 91.10 03/26/2016   LDLDIRECT 101.3 04/03/2011   LDLCALC 90 03/26/2016   ALT 12 03/26/2016   AST 15 03/26/2016   NA 139 03/26/2016   K 4.6 03/26/2016   CL 101 03/26/2016   CREATININE 1.45 03/26/2016   BUN 18 03/26/2016   CO2 31 03/26/2016   TSH 2.21 03/26/2016   PSA 2.03 04/03/2011    Dg Chest 2 View  06/02/2011  *RADIOLOGY REPORT* Clinical Data: History of injury from fall.  Injury to the left side of the chest. CHEST - 2 VIEW Comparison: 03/08/2007. Findings: The cardiac silhouette is normal size and shape. Ectasia and nonaneurysmal calcification of the thoracic aorta are seen. No pleural abnormality is evident.  There is flattening of the diaphragm on lateral image with slight hyperinflation configuration.  There is an area of subsegmental atelectasis in the right costophrenic angle.  No pleural effusion is seen. Osteophytes and syndesmophytes are seen in the spine. IMPRESSION: Flattening of the diaphragm on lateral image with slight hyperinflation configuration.  A small area of subsegmental atelectasis in right costophrenic angle region.  No pulmonary edema, pneumonia, or other acute process identified. Original Report Authenticated By: Delane Ginger, M.D.   Assessment & Plan:   Nicholas Frye was seen  today for annual exam and gastroesophageal reflux.  Diagnoses and all orders for this visit:  PVD- He has mild peripheral artery disease but denies claudication, he is not willing to take a statin but he does take a daily aspirin. -     Lipid panel; Future  Hyperlipidemia with target LDL less than 100- he is achieved his LDL goal and is not willing to take a statin. -     Lipid panel; Future -     Comprehensive metabolic panel; Future -     TSH; Future  Gastroesophageal reflux disease without esophagitis- his symptoms are well-controlled with the occasional dose of Tums, he does not want to take an H2 blocker. -     CBC with Differential/Platelet; Future  Irregular heart beat- his exam today revealed some extrasystoles, I asked for permission to do an EKG but he refused that, he tells me he has had PVCs  for many many years and they have never caused him any trouble, he is not interested in evaluating this any further.  Routine health maintenance   I have discontinued Nicholas Frye omeprazole. I am also having him maintain his aspirin, EPINEPHrine, and calcium carbonate.  No orders of the defined types were placed in this encounter.   See AVS for instructions about healthy living and anticipatory guidance.  Follow-up: Return if symptoms worsen or fail to improve.  [name], MD

## 2016-03-26 NOTE — Patient Instructions (Signed)

## 2016-03-26 NOTE — Progress Notes (Signed)
Pre visit review using our clinic review tool, if applicable. No additional management support is needed unless otherwise documented below in the visit note. 

## 2016-03-28 NOTE — Assessment & Plan Note (Signed)

## 2016-05-04 DIAGNOSIS — H9313 Tinnitus, bilateral: Secondary | ICD-10-CM | POA: Diagnosis not present

## 2016-05-04 DIAGNOSIS — H903 Sensorineural hearing loss, bilateral: Secondary | ICD-10-CM | POA: Diagnosis not present

## 2016-05-07 DIAGNOSIS — H903 Sensorineural hearing loss, bilateral: Secondary | ICD-10-CM | POA: Diagnosis not present

## 2016-05-07 DIAGNOSIS — H9313 Tinnitus, bilateral: Secondary | ICD-10-CM | POA: Diagnosis not present

## 2016-05-08 ENCOUNTER — Other Ambulatory Visit: Payer: Self-pay | Admitting: Otolaryngology

## 2016-05-08 DIAGNOSIS — H905 Unspecified sensorineural hearing loss: Secondary | ICD-10-CM

## 2016-05-11 ENCOUNTER — Ambulatory Visit
Admission: RE | Admit: 2016-05-11 | Discharge: 2016-05-11 | Disposition: A | Discharge status: Home / Self Care | Payer: Medicare Other | Source: Ambulatory Visit | Attending: Otolaryngology | Admitting: Otolaryngology

## 2016-05-11 DIAGNOSIS — H905 Unspecified sensorineural hearing loss: Secondary | ICD-10-CM | POA: Diagnosis not present

## 2016-05-11 MED ORDER — GADOBENATE DIMEGLUMINE 529 MG/ML IV SOLN
14.0000 mL | Freq: Once | INTRAVENOUS | Status: AC | PRN
  Administered 2016-05-11: 14 mL via INTRAVENOUS

## 2016-07-27 ENCOUNTER — Encounter: Payer: Self-pay | Admitting: Internal Medicine

## 2016-07-27 ENCOUNTER — Ambulatory Visit (INDEPENDENT_AMBULATORY_CARE_PROVIDER_SITE_OTHER)
Admission: RE | Admit: 2016-07-27 | Discharge: 2016-07-27 | Disposition: A | Discharge status: Home / Self Care | Payer: Medicare Other | Source: Ambulatory Visit | Attending: Internal Medicine | Admitting: Internal Medicine

## 2016-07-27 ENCOUNTER — Ambulatory Visit (INDEPENDENT_AMBULATORY_CARE_PROVIDER_SITE_OTHER): Payer: Medicare Other | Admitting: Internal Medicine

## 2016-07-27 VITALS — BP 124/72 | HR 64 | Temp 97.9°F | Resp 16 | Ht 67.0 in | Wt 148.5 lb

## 2016-07-27 DIAGNOSIS — G8929 Other chronic pain: Secondary | ICD-10-CM

## 2016-07-27 DIAGNOSIS — M545 Low back pain, unspecified: Secondary | ICD-10-CM

## 2016-07-27 DIAGNOSIS — M47816 Spondylosis without myelopathy or radiculopathy, lumbar region: Secondary | ICD-10-CM | POA: Diagnosis not present

## 2016-07-27 DIAGNOSIS — M5136 Other intervertebral disc degeneration, lumbar region: Secondary | ICD-10-CM

## 2016-07-27 NOTE — Progress Notes (Signed)
Subjective:  Patient ID: Nicholas Frye, male    DOB: 28-Sep-1930  Age: 82 y.o. MRN: [id number]  CC: Back Pain   HPI Nicholas Frye presents for concerns about low back pain. He tells me that he has had chronic low back pain for many years but now for the last 3 weeks it's more severe than usual. He denies any recent trauma or injury. He complains of an intermittent achy sensation in his left lower back more than the right lower back. The pain does not radiate into his lower extremities and he denies paresthesias. He has not taken anything for pain and he does not want a medication to control the pain. He offers no systemic complaints.  Outpatient Medications Prior to Visit  Medication Sig Dispense Refill  . aspirin 81 MG tablet Take 81 mg by mouth daily.      . calcium carbonate (TUMS - DOSED IN MG ELEMENTAL CALCIUM) 500 MG chewable tablet Chew 1 tablet by mouth daily.    Marland Kitchen EPINEPHrine (EPIPEN) 0.3 mg/0.3 mL IJ SOAJ injection Inject 0.3 mLs (0.3 mg total) into the muscle once. (Patient not taking: Reported on 03/26/2016) 1 Device 3   No facility-administered medications prior to visit.     ROS Review of Systems  Constitutional: Negative.  Negative for activity change, appetite change, chills, diaphoresis, fatigue and fever.  HENT: Negative.   Eyes: Negative.   Respiratory: Negative for cough, choking, chest tightness, shortness of breath and stridor.   Cardiovascular: Negative.  Negative for chest pain, palpitations and leg swelling.  Gastrointestinal: Negative for abdominal pain, blood in stool, constipation, diarrhea, nausea and vomiting.  Endocrine: Negative.   Genitourinary: Negative.  Negative for decreased urine volume, difficulty urinating, dysuria, flank pain, frequency, hematuria and urgency.  Musculoskeletal: Positive for back pain. Negative for neck pain.  Skin: Negative.   Allergic/Immunologic: Negative.   Neurological: Negative.  Negative for dizziness, tremors,  weakness, light-headedness and numbness.  Hematological: Negative.  Negative for adenopathy. Does not bruise/bleed easily.  Psychiatric/Behavioral: Negative.     Objective:  BP 124/72 (BP Location: Left Arm, Patient Position: Sitting, Cuff Size: Normal)   Pulse 64   Temp 97.9 F (36.6 C) (Oral)   Resp 16   Ht 5\' 7"  (1.702 m)   Wt 148 lb 8 oz (67.4 kg)   SpO2 97%   BMI 23.26 kg/m   BP Readings from Last 3 Encounters:  07/27/16 124/72  03/26/16 140/70  07/08/15 130/70    Wt Readings from Last 3 Encounters:  07/27/16 148 lb 8 oz (67.4 kg)  03/26/16 149 lb (67.6 kg)  03/04/15 149 lb 4 oz (67.7 kg)    Physical Exam  Constitutional: He is oriented to person, place, and time. No distress.  HENT:  Mouth/Throat: Oropharynx is clear and moist. No oropharyngeal exudate.  Eyes: Conjunctivae are normal. Right eye exhibits no discharge. Left eye exhibits no discharge. No scleral icterus.  Neck: Normal range of motion. Neck supple. No JVD present. No tracheal deviation present. No thyromegaly present.  Cardiovascular: Normal rate, regular rhythm, normal heart sounds and intact distal pulses.  Exam reveals no gallop and no friction rub.   No murmur heard. Pulmonary/Chest: Effort normal and breath sounds normal. No stridor. No respiratory distress. He has no wheezes. He has no rales. He exhibits no tenderness.  Abdominal: Soft. Bowel sounds are normal. He exhibits no distension and no mass. There is no tenderness. There is no rebound and no guarding.  Musculoskeletal: Normal  range of motion. He exhibits no edema, tenderness or deformity.       Lumbar back: Normal. He exhibits normal range of motion, no tenderness, no bony tenderness, no swelling, no edema, no deformity, no laceration, no pain and no spasm.  Lymphadenopathy:    He has no cervical adenopathy.  Neurological: He is oriented to person, place, and time. He has normal strength. He displays no atrophy, no tremor and normal  reflexes. No cranial nerve deficit or sensory deficit. He exhibits normal muscle tone. He displays a negative Romberg sign. He displays no seizure activity. Coordination and gait normal.  Reflex Scores:      Tricep reflexes are 1+ on the right side and 1+ on the left side.      Bicep reflexes are 1+ on the right side and 1+ on the left side.      Brachioradialis reflexes are 1+ on the right side and 1+ on the left side.      Patellar reflexes are 1+ on the right side and 1+ on the left side.      Achilles reflexes are 1+ on the right side and 1+ on the left side. Neg SLR in BLE  Skin: Skin is warm and dry. No rash noted. He is not diaphoretic. No erythema. No pallor.  Vitals reviewed.   Lab Results  Component Value Date   WBC 9.8 03/26/2016   HGB 16.0 03/26/2016   HCT 47.5 03/26/2016   PLT 163.0 03/26/2016   GLUCOSE 93 03/26/2016   CHOL 190 03/26/2016   TRIG 43.0 03/26/2016   HDL 91.10 03/26/2016   LDLDIRECT 101.3 04/03/2011   LDLCALC 90 03/26/2016   ALT 12 03/26/2016   AST 15 03/26/2016   NA 139 03/26/2016   K 4.6 03/26/2016   CL 101 03/26/2016   CREATININE 1.45 03/26/2016   BUN 18 03/26/2016   CO2 31 03/26/2016   TSH 2.21 03/26/2016   PSA 2.03 04/03/2011    Mr Brain W Wo Contrast  Result Date: 05/11/2016 CLINICAL DATA:  New onset sensorineural hearing loss Creatinine was obtained on site at [facility] at [address] Results: Creatinine 1.3 mg/dL. EXAM: MRI HEAD WITHOUT AND WITH CONTRAST TECHNIQUE: Multiplanar, multiecho pulse sequences of the brain and surrounding structures were obtained without and with intravenous contrast. CONTRAST:  49mL MULTIHANCE GADOBENATE DIMEGLUMINE 529 MG/ML IV SOLN COMPARISON:  None. FINDINGS: IAC protocol was performed including thin section imaging through the posterior fossa before and after intravenous contrast. Seventh and eighth cranial nerves are normal. Negative for vestibular schwannoma. Brainstem and cerebellum normal. No  mass lesion in the posterior fossa. Mastoid sinus is clear bilaterally. Temporal bone is normal. Dural venous sinuses enhance normally. Generalized atrophy.  Negative for hydrocephalus. Negative for acute infarct. Minimal chronic microvascular ischemic changes in the white matter. Postcontrast imaging of the entire brain reveals normal enhancement. No enhancing mass lesion. Normal vascular enhancement. Pituitary normal in size. Cavernous sinus normal. Mild mucosal edema in the paranasal sinuses without air-fluid level. Bilateral lens replacement. IMPRESSION: No cause for hearing loss identified. Negative for vestibular schwannoma Generalized atrophy with minimal chronic microvascular ischemia. No acute abnormality. Electronically Signed   By: Franchot Gallo M.D.   On: 05/11/2016 13:18    Assessment & Plan:   Sahand was seen today for back pain.  Diagnoses and all orders for this visit:  Chronic low back pain without sciatica, unspecified back pain laterality- he has chronic and now worsening low back pain with no  alarm features, his plain film is positive for degenerative disc disease. I will refer to pain management to consider conservative treatment options since he does not want to take a medication or consider having surgery for this. -     DG Lumbar Spine Complete; Future -     Ambulatory referral to Pain Clinic  DDD (degenerative disc disease), lumbar- as above -     Ambulatory referral to Pain Clinic   I am having Mr. Curl maintain his aspirin, EPINEPHrine, calcium carbonate, and Multiple Vitamins-Minerals (PRESERVISION AREDS PO).  Meds ordered this encounter  Medications  . Multiple Vitamins-Minerals (PRESERVISION AREDS PO)    Sig: Take by mouth.     Follow-up: Return in about 6 weeks (around 09/07/2016).  [name], MD

## 2016-07-27 NOTE — Progress Notes (Signed)
Pre visit review using our clinic review tool, if applicable. No additional management support is needed unless otherwise documented below in the visit note. 

## 2016-07-27 NOTE — Patient Instructions (Signed)

## 2016-07-30 ENCOUNTER — Encounter: Payer: Self-pay | Admitting: Internal Medicine

## 2016-07-31 ENCOUNTER — Telehealth: Payer: Self-pay | Admitting: Internal Medicine

## 2016-07-31 NOTE — Telephone Encounter (Signed)
Nicholas Frye has started on the referral for pain management.

## 2016-07-31 NOTE — Telephone Encounter (Signed)
patientcalled in to check status of pain mgmt referral. Advised no status, but I did explain the expected time frame for pain mgmt. Please initiate referral and send the patient a mychart message to advise that you have

## 2016-08-28 DIAGNOSIS — M47817 Spondylosis without myelopathy or radiculopathy, lumbosacral region: Secondary | ICD-10-CM | POA: Diagnosis not present

## 2016-08-28 DIAGNOSIS — G894 Chronic pain syndrome: Secondary | ICD-10-CM | POA: Diagnosis not present

## 2016-08-28 DIAGNOSIS — Z79899 Other long term (current) drug therapy: Secondary | ICD-10-CM | POA: Diagnosis not present

## 2016-08-28 DIAGNOSIS — M545 Low back pain: Secondary | ICD-10-CM | POA: Diagnosis not present

## 2016-08-28 DIAGNOSIS — Z79891 Long term (current) use of opiate analgesic: Secondary | ICD-10-CM | POA: Diagnosis not present

## 2016-08-28 DIAGNOSIS — M5137 Other intervertebral disc degeneration, lumbosacral region: Secondary | ICD-10-CM | POA: Diagnosis not present

## 2016-09-03 ENCOUNTER — Telehealth: Payer: Self-pay | Admitting: Internal Medicine

## 2016-09-03 NOTE — Telephone Encounter (Signed)
Patient informed that PCP has paperwork and will sign as soon as he can.

## 2016-09-03 NOTE — Telephone Encounter (Signed)
Patient walked in looking for a form he dropped off last fridasy for a trip that he is taking. I was not able to find it and you were in a room. Please call the patiwent with resolution. He will come pick it up. Ok to leave vm on home phone #

## 2016-09-08 ENCOUNTER — Encounter: Payer: Self-pay | Admitting: Internal Medicine

## 2016-09-08 NOTE — Telephone Encounter (Signed)
Pt stated that message he sent could be disregarded.

## 2016-09-08 NOTE — Telephone Encounter (Signed)
Pt contacted and informed papers are ready for pick up.  Copy sent to scan.

## 2016-09-10 DIAGNOSIS — Z79891 Long term (current) use of opiate analgesic: Secondary | ICD-10-CM | POA: Diagnosis not present

## 2016-09-10 DIAGNOSIS — Z79899 Other long term (current) drug therapy: Secondary | ICD-10-CM | POA: Diagnosis not present

## 2016-09-10 DIAGNOSIS — M47817 Spondylosis without myelopathy or radiculopathy, lumbosacral region: Secondary | ICD-10-CM | POA: Diagnosis not present

## 2016-09-10 DIAGNOSIS — M5137 Other intervertebral disc degeneration, lumbosacral region: Secondary | ICD-10-CM | POA: Diagnosis not present

## 2016-09-10 DIAGNOSIS — G894 Chronic pain syndrome: Secondary | ICD-10-CM | POA: Diagnosis not present

## 2016-09-17 DIAGNOSIS — H353111 Nonexudative age-related macular degeneration, right eye, early dry stage: Secondary | ICD-10-CM | POA: Diagnosis not present

## 2016-09-17 DIAGNOSIS — Z961 Presence of intraocular lens: Secondary | ICD-10-CM | POA: Diagnosis not present

## 2016-09-17 DIAGNOSIS — H353121 Nonexudative age-related macular degeneration, left eye, early dry stage: Secondary | ICD-10-CM | POA: Diagnosis not present

## 2016-09-17 DIAGNOSIS — H52223 Regular astigmatism, bilateral: Secondary | ICD-10-CM | POA: Diagnosis not present

## 2016-10-19 DIAGNOSIS — Z79899 Other long term (current) drug therapy: Secondary | ICD-10-CM | POA: Diagnosis not present

## 2016-10-19 DIAGNOSIS — G894 Chronic pain syndrome: Secondary | ICD-10-CM | POA: Diagnosis not present

## 2016-10-19 DIAGNOSIS — M47817 Spondylosis without myelopathy or radiculopathy, lumbosacral region: Secondary | ICD-10-CM | POA: Diagnosis not present

## 2016-10-19 DIAGNOSIS — M545 Low back pain: Secondary | ICD-10-CM | POA: Diagnosis not present

## 2016-10-19 DIAGNOSIS — M5137 Other intervertebral disc degeneration, lumbosacral region: Secondary | ICD-10-CM | POA: Diagnosis not present

## 2016-10-19 DIAGNOSIS — Z79891 Long term (current) use of opiate analgesic: Secondary | ICD-10-CM | POA: Diagnosis not present

## 2016-11-09 DIAGNOSIS — M5137 Other intervertebral disc degeneration, lumbosacral region: Secondary | ICD-10-CM | POA: Diagnosis not present

## 2016-11-09 DIAGNOSIS — M47817 Spondylosis without myelopathy or radiculopathy, lumbosacral region: Secondary | ICD-10-CM | POA: Diagnosis not present

## 2017-03-22 DIAGNOSIS — R94112 Abnormal visually evoked potential [VEP]: Secondary | ICD-10-CM | POA: Diagnosis not present

## 2017-03-22 DIAGNOSIS — Z961 Presence of intraocular lens: Secondary | ICD-10-CM | POA: Diagnosis not present

## 2017-03-22 DIAGNOSIS — H353132 Nonexudative age-related macular degeneration, bilateral, intermediate dry stage: Secondary | ICD-10-CM | POA: Diagnosis not present

## 2017-04-20 ENCOUNTER — Ambulatory Visit (INDEPENDENT_AMBULATORY_CARE_PROVIDER_SITE_OTHER): Payer: Medicare Other | Admitting: Internal Medicine

## 2017-04-20 ENCOUNTER — Encounter: Payer: Self-pay | Admitting: Internal Medicine

## 2017-04-20 ENCOUNTER — Ambulatory Visit (INDEPENDENT_AMBULATORY_CARE_PROVIDER_SITE_OTHER)
Admission: RE | Admit: 2017-04-20 | Discharge: 2017-04-20 | Disposition: A | Discharge status: Home / Self Care | Payer: Medicare Other | Source: Ambulatory Visit | Attending: Internal Medicine | Admitting: Internal Medicine

## 2017-04-20 VITALS — BP 128/70 | HR 74 | Temp 97.8°F | Resp 16 | Ht 67.0 in | Wt 149.0 lb

## 2017-04-20 DIAGNOSIS — M542 Cervicalgia: Secondary | ICD-10-CM | POA: Diagnosis not present

## 2017-04-20 NOTE — Progress Notes (Signed)
Subjective:  Patient ID: Nicholas Frye, male    DOB: 1929/12/25  Age: 83 y.o. MRN: [id number]  CC: Neck Pain   HPI [name] presents for sharp, left neck pain for 3 weeks. The pain radiates to the top of his left shoulder but not into his arms. He denies any recent injury and denies numbness/weakness/tingling. He has taken a combination of Aleve and Tylenol for symptom relief. He tells me the pain is resolving and he does not want me to prescribe anything for his discomfort.  Outpatient Medications Prior to Visit  Medication Sig Dispense Refill  . aspirin 81 MG tablet Take 81 mg by mouth daily.      . calcium carbonate (TUMS - DOSED IN MG ELEMENTAL CALCIUM) 500 MG chewable tablet Chew 1 tablet by mouth daily.    Marland Kitchen EPINEPHrine (EPIPEN) 0.3 mg/0.3 mL IJ SOAJ injection Inject 0.3 mLs (0.3 mg total) into the muscle once. 1 Device 3  . Multiple Vitamins-Minerals (PRESERVISION AREDS PO) Take by mouth.     No facility-administered medications prior to visit.     ROS Review of Systems  Constitutional: Negative.  Negative for diaphoresis and fatigue.  HENT: Negative.  Negative for trouble swallowing.   Eyes: Negative for visual disturbance.  Respiratory: Negative for cough, chest tightness, shortness of breath and wheezing.   Cardiovascular: Negative for chest pain, palpitations and leg swelling.  Gastrointestinal: Negative for abdominal pain, constipation, diarrhea, nausea and vomiting.  Genitourinary: Negative.  Negative for difficulty urinating, dysuria and urgency.  Musculoskeletal: Positive for neck pain. Negative for arthralgias, back pain and neck stiffness.  Skin: Negative.   Allergic/Immunologic: Negative.   Neurological: Negative.  Negative for dizziness, weakness, numbness and headaches.  Hematological: Negative for adenopathy. Does not bruise/bleed easily.  Psychiatric/Behavioral: Negative.     Objective:  BP 128/70 (BP Location: Left Arm, Patient Position:  Sitting, Cuff Size: Normal)   Pulse 74   Temp 97.8 F (36.6 C) (Oral)   Resp 16   Ht 5\' 7"  (1.702 m)   Wt 149 lb (67.6 kg)   SpO2 96%   BMI 23.34 kg/m   BP Readings from Last 3 Encounters:  04/20/17 128/70  07/27/16 124/72  03/26/16 140/70    Wt Readings from Last 3 Encounters:  04/20/17 149 lb (67.6 kg)  07/27/16 148 lb 8 oz (67.4 kg)  03/26/16 149 lb (67.6 kg)    Physical Exam  Constitutional: No distress.  HENT:  Mouth/Throat: Oropharynx is clear and moist. No oropharyngeal exudate.  Eyes: Conjunctivae are normal. Right eye exhibits no discharge. Left eye exhibits no discharge. No scleral icterus.  Neck: Normal range of motion. Neck supple. No JVD present. No tracheal deviation present. No thyromegaly present.  Cardiovascular: Normal rate, regular rhythm, normal heart sounds and intact distal pulses.  Exam reveals no gallop and no friction rub.   No murmur heard. Pulmonary/Chest: Effort normal and breath sounds normal. No stridor. No respiratory distress. He has no wheezes. He has no rales. He exhibits no tenderness.  Abdominal: Soft. Bowel sounds are normal. He exhibits no distension and no mass. There is no tenderness. There is no rebound and no guarding.  Musculoskeletal: Normal range of motion. He exhibits no edema, tenderness or deformity.       Cervical back: Normal. He exhibits normal range of motion, no tenderness, no bony tenderness, no swelling, no edema, no deformity, no pain and no spasm.  Lymphadenopathy:    He has no cervical adenopathy.  Neurological: He displays no atrophy, no tremor and normal reflexes. No sensory deficit. He exhibits normal muscle tone. He displays no seizure activity. Coordination and gait normal.  Reflex Scores:      Tricep reflexes are 1+ on the right side and 1+ on the left side.      Bicep reflexes are 1+ on the right side and 1+ on the left side.      Brachioradialis reflexes are 1+ on the right side and 1+ on the left side.       Patellar reflexes are 1+ on the right side and 1+ on the left side.      Achilles reflexes are 1+ on the right side and 1+ on the left side. Skin: Skin is warm and dry. No rash noted. He is not diaphoretic. No erythema. No pallor.  Vitals reviewed.   Lab Results  Component Value Date   WBC 9.8 03/26/2016   HGB 16.0 03/26/2016   HCT 47.5 03/26/2016   PLT 163.0 03/26/2016   GLUCOSE 93 03/26/2016   CHOL 190 03/26/2016   TRIG 43.0 03/26/2016   HDL 91.10 03/26/2016   LDLDIRECT 101.3 04/03/2011   LDLCALC 90 03/26/2016   ALT 12 03/26/2016   AST 15 03/26/2016   NA 139 03/26/2016   K 4.6 03/26/2016   CL 101 03/26/2016   CREATININE 1.45 03/26/2016   BUN 18 03/26/2016   CO2 31 03/26/2016   TSH 2.21 03/26/2016   PSA 2.03 04/03/2011    Dg Lumbar Spine Complete  Result Date: 07/27/2016 CLINICAL DATA:  Low back pain.  No known injury. EXAM: LUMBAR SPINE - COMPLETE 4+ VIEW COMPARISON:  No recent prior . FINDINGS: Diffuse multilevel degenerative change. No acute abnormality identified. Three mm retrolisthesis L3 on L4. Similar finding noted T12-L1. IMPRESSION: 1. Diffuse multilevel degenerative change. 3 mm retrolisthesis L3 on L4 and T12 on L1. No acute bony abnormality. 2. Aortoiliac atherosclerotic vascular disease . Electronically Signed   By: Marcello Moores  Register   On: 07/27/2016 09:56   Dg Cervical Spine Complete  Result Date: 04/21/2017 CLINICAL DATA:  83 year old male with left neck pain for 3 weeks and no known injury. EXAM: CERVICAL SPINE - COMPLETE 4+ VIEW COMPARISON:  Brain MRI 05/11/2016. FINDINGS: Normal prevertebral soft tissue contour. Cervicothoracic junction alignment is within normal limits. Bilateral posterior element alignment is within normal limits. Normal cervical AP alignment. Normal C1-C2 alignment and joint spaces. Odontoid and lung apices appear normal. There is severe disc space loss at C5-C6 with endplate spurring. There is moderate disc space loss with endplate spurring  at W4-Y6, and to a lesser extent C4-C5. IMPRESSION: 1.  No acute osseous abnormality in the cervical spine. 2. Severe chronic disc degeneration with endplate spurring at Z9-D3. Moderate degenerative changes at C6-C7 and C4-C5. Electronically Signed   By: Genevie Ann M.D.   On: 04/21/2017 09:27     Assessment & Plan:   Jakolby was seen today for neck pain.  Diagnoses and all orders for this visit:  Neck pain on left side- his neurologic exam is negative for any evidence of radiculopathy, his symptoms are improving and is getting adequate symptom relief with Aleve and Tylenol, his plain film is negative for fracture but as expected at his age there is degenerative disc disease and bone spurring. For now he will continue the current treatment options and since his pain is resolving I do not recommend any additional therapeutic or diagnostic alternatives at this time. -  DG Cervical Spine Complete; Future   I am having Mr. Monnin maintain his aspirin, EPINEPHrine, calcium carbonate, and Multiple Vitamins-Minerals (PRESERVISION AREDS PO).  No orders of the defined types were placed in this encounter.    Follow-up: Return if symptoms worsen or fail to improve.  [name], MD

## 2017-04-20 NOTE — Patient Instructions (Signed)
Acute Torticollis Torticollis is a condition in which the muscles of the neck tighten (contract) abnormally, causing the neck to twist and the head to move into an unnatural position. Torticollis that develops suddenly is called acute torticollis. If torticollis becomes chronic and is left untreated, the face and neck can become deformed. CAUSES This condition may be caused by:  Sleeping in an awkward position (common).  Extending or twisting the neck muscles beyond their normal position.  Infection. In some cases, the cause may not be known. SYMPTOMS Symptoms of this condition include:  An unnatural position of the head.  Neck pain.  A limited ability to move the neck.  Twisting of the neck to one side. DIAGNOSIS This condition is diagnosed with a physical exam. You may also have imaging tests, such as an X-ray, CT scan, or MRI. TREATMENT Treatment for this condition involves trying to relax the neck muscles. It may include:  Medicines or shots.  Physical therapy.  Surgery. This may be done in severe cases. HOME CARE INSTRUCTIONS  Take medicines only as directed by your health care provider.  Do stretching exercises and massage your neck as directed by your health care provider.  Keep all follow-up visits as directed by your health care provider. This is important. SEEK MEDICAL CARE IF:  You develop a fever. SEEK IMMEDIATE MEDICAL CARE IF:  You develop difficulty breathing.  You develop noisy breathing (stridor).  You start drooling.  You have trouble swallowing or have pain with swallowing.  You develop numbness or weakness in your hands or feet.  You have changes in your speech, understanding, or vision.  Your pain gets worse. This information is not intended to replace advice given to you by your health care provider. Make sure you discuss any questions you have with your health care provider. Document Released: 11/13/2000 Document Revised: 03/09/2016  Document Reviewed: 11/12/2014 Elsevier Interactive Patient Education  2017 [employer] Inc[employer]  

## 2017-04-21 ENCOUNTER — Encounter: Payer: Self-pay | Admitting: Internal Medicine

## 2017-04-24 ENCOUNTER — Ambulatory Visit (INDEPENDENT_AMBULATORY_CARE_PROVIDER_SITE_OTHER): Payer: Medicare Other | Admitting: Family Medicine

## 2017-04-24 ENCOUNTER — Encounter: Payer: Self-pay | Admitting: Family Medicine

## 2017-04-24 VITALS — BP 157/84 | HR 73 | Temp 98.6°F | Resp 16 | Ht 66.0 in | Wt 151.0 lb

## 2017-04-24 DIAGNOSIS — H938X2 Other specified disorders of left ear: Secondary | ICD-10-CM | POA: Diagnosis not present

## 2017-04-24 DIAGNOSIS — T7840XA Allergy, unspecified, initial encounter: Secondary | ICD-10-CM | POA: Diagnosis not present

## 2017-04-24 MED ORDER — PREDNISONE 10 MG PO TABS: 10.0000 mg | ORAL_TABLET | Freq: Every day | ORAL | 0 refills | Status: DC

## 2017-04-24 MED ORDER — NEOMYCIN-POLYMYXIN-HC 3.5-10000-1 OT SOLN: 4.0000 [drp] | Freq: Four times a day (QID) | OTIC | 0 refills | Status: DC

## 2017-04-24 NOTE — Progress Notes (Signed)
Nicholas Frye is a 83 y.o. male who presents to Primary Care at [facility] today for Left ear swelling:  1.  Left ear swelling:  Present x 1 day. Patient wears hearing aids.  Noted that he was having some pressure on his hearing aid, took this out, and was not able to put it back in.  No injury to his ear.  No bites or stings that he knows of.  Does work in his garden for much of the day every day, doesn't know if he rubbed up against a plant to which he may be allergic.  Has had some muffled hearing in that ear. No pain to the ear. He does have chronic neck pain. He has been using BenGay cream but hasn't uses for about 3 days and has not put this anywhere near his ear. No pain or trouble with his right ear.  Only allergy that he knows of is to yellow jacket venom.  No fevers or chills.  No headaches.  No ear drainage.   PMH:  Has history of arthritis and cataracts for which he has had eye surgery.  Family history: Brother with heart disease. Sister is healthy. Both parents are deceased. His no family history of ear problems.  ROS as above.    PMH reviewed. Patient is a nonsmoker.   Past Medical History:  Diagnosis Date  . Elevated prostate specific antigen (PSA)    aged out  . GERD (gastroesophageal reflux disease)   . Lumbago   . Other voice and resonance disorders    damage from reflux  . Personal history of colonic polyps   . Unspecified tinnitus    resolved   Past Surgical History:  Procedure Laterality Date  . CATARACT EXTRACTION Bilateral 11/30/1998  . CATARACT EXTRACTION Bilateral 2000  . CATARACT EXTRACTION W/ INTRAOCULAR LENS  IMPLANT, BILATERAL    . punch biopsy of a lesion on forehead    . TONSILLECTOMY    . transrectal prostate biopsies that were negative      Medications reviewed. Current Outpatient Prescriptions  Medication Sig Dispense Refill  . aspirin 81 MG tablet Take 81 mg by mouth daily.      . calcium carbonate (TUMS - DOSED IN MG ELEMENTAL CALCIUM)  500 MG chewable tablet Chew 1 tablet by mouth daily.    . Multiple Vitamins-Minerals (PRESERVISION AREDS PO) Take by mouth.    . EPINEPHrine (EPIPEN) 0.3 mg/0.3 mL IJ SOAJ injection Inject 0.3 mLs (0.3 mg total) into the muscle once. (Patient not taking: Reported on 04/24/2017) 1 Device 3   No current facility-administered medications for this visit.      Physical Exam:  BP (!) 157/84   Pulse 73   Temp 98.6 F (37 C)   Resp 16   Ht 5\' 6"  (1.676 m)   Wt 151 lb (68.5 kg)   SpO2 98%   BMI 24.37 kg/m  Gen:  Patient sitting on exam table, appears stated age in no acute distress.  Looks very well.   Head: Normocephalic atraumatic Eyes: EOMI, PERRL, sclera and conjunctiva non-erythematous Ears:  Right ear:  Exam s/p removal of right hearing aid.  Canal clear on the right. Normal external ear. TM pearly gray without erythema or bulging.  - Left ear with notable edema of external ear. Some mild erythema along helix of ear. He does have some swelling to his canal as well. However I'm able to insert otoscope speculum and can see his tympanic membrane. It is  intact without any signs of infection. He doesn't have any drainage in his ear. Does have some mild cerumen in canal but no cerumen impaction.  Ear is not tender to palpation or tugging.  Nose:  Nasal turbinates WNL BL Mouth: Mucosa membranes moist. Tonsils +2, nonenlarged, non-erythematous. Neck: No cervical lymphadenopathy noted   Assessment and Plan:  1.  Left ear allergic reaction: - Notable swelling to external ear. Treating this as allergic reaction. Prednisone plus Cortisporin Otic. -If he does have some degree of otitis externa the Cortisporin Otic will help with this. -I do not think he has any cellulitis of his ear. He has no pain to palpation or with tugging of his ear. He only has some mild erythema. Erythema does not extend past his ear. - return precautions provided.

## 2017-04-24 NOTE — Patient Instructions (Signed)
We are treating your ear swelling as allergic reaction. Because you're not having any pain I don't think this is an infection.  Take the prednisone as follows:  Take 6 pills today, 5 pills tomorrow, 4 pills today after, 3 pills after, 2 pills after, 1 pill last day.    Use the cortisporin ear drops 4 drops four times a day for the next 5 days.   If you're still having swelling at that point, come back and see [place].    If at any point you start having fevers, pain in that ear, or you noticed redness spreading from ear come back immediately or go to the emergency room.

## 2017-07-02 NOTE — Progress Notes (Deleted)
Subjective:   Nicholas Frye is a 83 y.o. male who presents for Medicare Annual/Subsequent preventive examination.  Review of Systems:  No ROS.  Medicare Wellness Visit. Additional risk factors are reflected in the social history.    Sleep patterns: {SX; SLEEP PATTERNS:18802::"feels rested on waking","does not get up to void","gets up *** times nightly to void","sleeps *** hours nightly"}.    Home Safety/Smoke Alarms: Feels safe in home. Smoke alarms in place.  Living environment; residence and Firearm Safety: {Rehab home environment / accessibility:30080::"no firearms","firearms stored safely"}. Seat Belt Safety/Bike Helmet: Wears seat belt.   Counseling:   Eye Exam-  Dental-  Male:   CCS- N/A    PSA-  Lab Results  Component Value Date   PSA 2.03 04/03/2011       Objective:    Vitals: There were no vitals taken for this visit.  There is no height or weight on file to calculate BMI.  Tobacco History  Smoking Status  . Never Smoker  Smokeless Tobacco  . Never Used     Counseling given: Not Answered   Past Medical History:  Diagnosis Date  . Elevated prostate specific antigen (PSA)    aged out  . GERD (gastroesophageal reflux disease)   . Lumbago   . Other voice and resonance disorders    damage from reflux  . Personal history of colonic polyps   . Unspecified tinnitus    resolved   Past Surgical History:  Procedure Laterality Date  . CATARACT EXTRACTION Bilateral 11/30/1998  . CATARACT EXTRACTION Bilateral 2000  . CATARACT EXTRACTION W/ INTRAOCULAR LENS  IMPLANT, BILATERAL    . punch biopsy of a lesion on forehead    . TONSILLECTOMY    . transrectal prostate biopsies that were negative     Family History  Problem Relation Age of Onset  . Peripheral vascular disease Mother   . Other Mother        bilateral Leg amputation  . Coronary artery disease Father   . Heart attack Father   . Heart disease Father        heart failure/ acute MI  .  Peripheral vascular disease Sister   . Coronary artery disease Brother        valve replaced  . Heart disease Brother        MI x2, CABG, AVR  . Cancer Neg Hx        colon or prostate  . Diabetes Neg Hx    History  Sexual Activity  . Sexual activity: No    Outpatient Encounter Prescriptions as of 07/05/2017  Medication Sig  . aspirin 81 MG tablet Take 81 mg by mouth daily.    . calcium carbonate (TUMS - DOSED IN MG ELEMENTAL CALCIUM) 500 MG chewable tablet Chew 1 tablet by mouth daily.  Marland Kitchen EPINEPHrine (EPIPEN) 0.3 mg/0.3 mL IJ SOAJ injection Inject 0.3 mLs (0.3 mg total) into the muscle once. (Patient not taking: Reported on 04/24/2017)  . Multiple Vitamins-Minerals (PRESERVISION AREDS PO) Take by mouth.  . neomycin-polymyxin-hydrocortisone (CORTISPORIN) otic solution Place 4 drops into the left ear 4 (four) times daily.  . predniSONE (DELTASONE) 10 MG tablet Take 1 tablet (10 mg total) by mouth daily with breakfast.   No facility-administered encounter medications on file as of 07/05/2017.     Activities of Daily Living No flowsheet data found.  Patient Care Team: Janith Lima, MD as PCP - General (Internal Medicine) Keene Breath., MD (Ophthalmology) Keisler, Ok Edwards,  MD as Consulting Physician (Family Medicine)   Assessment:    Physical assessment deferred to PCP.  Exercise Activities and Dietary recommendations   Diet (meal preparation, eat out, water intake, caffeinated beverages, dairy products, fruits and vegetables): {Desc; diets:16563} Breakfast: Lunch:  Dinner:      Goals    None     Fall Risk Fall Risk  04/24/2017 03/28/2016 03/26/2016 03/05/2015 03/04/2015  Falls in the past year? No No No No No  Risk for fall due to : - - - - (No Data)  Risk for fall due to: Comment - - - - No issues noted   Depression Screen PHQ 2/9 Scores 04/24/2017 03/28/2016 03/26/2016 03/05/2015  PHQ - 2 Score 0 0 0 0    Cognitive Function MMSE - Mini Mental State Exam 03/04/2015    Orientation to time 5  Orientation to Place 5  Registration 3  Attention/ Calculation 5  Recall 2  Language- name 2 objects 2  Language- repeat 1  Language- follow 3 step command 3  Language- read & follow direction 1  Write a sentence 1  Copy design 1  Total score 29        Immunization History  Administered Date(s) Administered  . Pneumococcal Conjugate-13 10/08/2014  . Pneumococcal Polysaccharide-23 11/30/2002  . Td 11/30/2002, 09/14/2013  . Zoster 02/28/2006   Screening Tests Health Maintenance  Topic Date Due  . INFLUENZA VACCINE  06/30/2017  . TETANUS/TDAP  09/15/2023  . PNA vac Low Risk Adult  Completed      Plan:     I have personally reviewed and noted the following in the patient's chart:   . Medical and social history . Use of alcohol, tobacco or illicit drugs  . Current medications and supplements . Functional ability and status . Nutritional status . Physical activity . Advanced directives . List of other physicians . Vitals . Screenings to include cognitive, depression, and falls . Referrals and appointments  In addition, I have reviewed and discussed with patient certain preventive protocols, quality metrics, and best practice recommendations. A written personalized care plan for preventive services as well as general preventive health recommendations were provided to patient.     Michiel Cowboy, RN  07/02/2017

## 2017-07-02 NOTE — Progress Notes (Deleted)
Pre visit review using our clinic review tool, if applicable. No additional management support is needed unless otherwise documented below in the visit note. 

## 2017-07-05 ENCOUNTER — Ambulatory Visit: Payer: Medicare Other

## 2017-07-14 NOTE — Progress Notes (Addendum)
Subjective:   Nicholas Frye is a 83 y.o. male who presents for Medicare Annual/Subsequent preventive examination.  Review of Systems:  No ROS.  Medicare Wellness Visit. Additional risk factors are reflected in the social history.  Cardiac Risk Factors include: advanced age (>36men, >73 women);dyslipidemia;male gender Sleep patterns: feels rested on waking, gets up 2-3 times nightly to void and sleeps 6-7 hours nightly.    Home Safety/Smoke Alarms: Feels safe in home. Smoke alarms in place.  Living environment; residence and Firearm Safety: 1-story house/ trailer, no firearms.Lives with wife, no needs for DME, good support system Seat Belt Safety/Bike Helmet: Wears seat belt.   Counseling:   Eye Exam- appointment every 6 months, Dr. Hampton [facility]- appointment every 6 months  Male:   CCS- N/A    PSA-  Lab Results  Component Value Date   PSA 2.03 04/03/2011       Objective:    Vitals: BP 126/68   Pulse 63   Resp 20   Ht 5\' 6"  (1.676 m)   Wt 149 lb (67.6 kg)   SpO2 98%   BMI 24.05 kg/m   Body mass index is 24.05 kg/m.  Tobacco History  Smoking Status  . Never Smoker  Smokeless Tobacco  . Never Used     Counseling given: Not Answered   Past Medical History:  Diagnosis Date  . Elevated prostate specific antigen (PSA)    aged out  . GERD (gastroesophageal reflux disease)   . Lumbago   . Other voice and resonance disorders    damage from reflux  . Personal history of colonic polyps   . Unspecified tinnitus    resolved   Past Surgical History:  Procedure Laterality Date  . CATARACT EXTRACTION Bilateral 11/30/1998  . CATARACT EXTRACTION Bilateral 2000  . CATARACT EXTRACTION W/ INTRAOCULAR LENS  IMPLANT, BILATERAL    . punch biopsy of a lesion on forehead    . TONSILLECTOMY    . transrectal prostate biopsies that were negative     Family History  Problem Relation Age of Onset  . Peripheral vascular disease Mother   . Other Mother        bilateral  Leg amputation  . Coronary artery disease Father   . Heart attack Father   . Heart disease Father        heart failure/ acute MI  . Peripheral vascular disease Sister   . Coronary artery disease Brother        valve replaced  . Heart disease Brother        MI x2, CABG, AVR  . Cancer Neg Hx        colon or prostate  . Diabetes Neg Hx    History  Sexual Activity  . Sexual activity: No    Outpatient Encounter Prescriptions as of 07/15/2017  Medication Sig  . aspirin 81 MG tablet Take 81 mg by mouth daily.    . calcium carbonate (TUMS - DOSED IN MG ELEMENTAL CALCIUM) 500 MG chewable tablet Chew 1 tablet by mouth daily.  Marland Kitchen EPINEPHrine (EPIPEN) 0.3 mg/0.3 mL IJ SOAJ injection Inject 0.3 mLs (0.3 mg total) into the muscle once.  . Multiple Vitamins-Minerals (PRESERVISION AREDS PO) Take by mouth.  . [DISCONTINUED] neomycin-polymyxin-hydrocortisone (CORTISPORIN) otic solution Place 4 drops into the left ear 4 (four) times daily. (Patient not taking: Reported on 07/15/2017)  . [DISCONTINUED] predniSONE (DELTASONE) 10 MG tablet Take 1 tablet (10 mg total) by mouth daily with breakfast.   No  facility-administered encounter medications on file as of 07/15/2017.     Activities of Daily Living In your present state of health, do you have any difficulty performing the following activities: 07/15/2017  Hearing? Y  Vision? N  Difficulty concentrating or making decisions? N  Walking or climbing stairs? N  Dressing or bathing? N  Doing errands, shopping? N  Preparing Food and eating ? N  Using the Toilet? N  In the past six months, have you accidently leaked urine? N  Do you have problems with loss of bowel control? N  Managing your Medications? N  Managing your Finances? N  Housekeeping or managing your Housekeeping? N  Some recent data might be hidden    Patient Care Team: Janith Lima, MD as PCP - General (Internal Medicine) Keene Breath., MD (Ophthalmology) Meredith Mody, MD  as Consulting Physician (Family Medicine)   Assessment:    Physical assessment deferred to PCP.  Exercise Activities and Dietary recommendations Current Exercise Habits: Home exercise routine, Type of exercise: walking, Time (Minutes): 55, Frequency (Times/Week): 4, Weekly Exercise (Minutes/Week): 220, Exercise limited by: None identified  Diet (meal preparation, eat out, water intake, caffeinated beverages, dairy products, fruits and vegetables): in general, a "healthy" diet  , well balanced, eats a variety of fruits and vegetables daily, limits salt, fat/cholesterol, sugar, caffeine, drinks 1-2 glasses of water daily.  Encouraged patient to increase daily water intake.     Goals    . 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      Fall Risk Fall Risk  04/24/2017 03/28/2016 03/26/2016 03/05/2015 03/04/2015  Falls in the past year? No No No No No  Risk for fall due to : - - - - (No Data)  Risk for fall due to: Comment - - - - No issues noted   Depression Screen PHQ 2/9 Scores 07/15/2017 04/24/2017 03/28/2016 03/26/2016  PHQ - 2 Score 0 0 0 0    Cognitive Function MMSE - Mini Mental State Exam 07/15/2017 03/04/2015  Orientation to time 5 5  Orientation to Place 5 5  Registration 3 3  Attention/ Calculation 5 5  Recall 1 2  Language- name 2 objects 2 2  Language- repeat 1 1  Language- follow 3 step command 3 3  Language- read & follow direction 1 1  Write a sentence 1 1  Copy design 1 1  Total score 28 29        Immunization History  Administered Date(s) Administered  . Pneumococcal Conjugate-13 10/08/2014  . Pneumococcal Polysaccharide-23 11/30/2002  . Td 11/30/2002, 09/14/2013  . Zoster 02/28/2006   Screening Tests Health Maintenance  Topic Date Due  . INFLUENZA VACCINE  06/30/2017  . TETANUS/TDAP  09/15/2023  . PNA vac Low Risk Adult  Completed      Plan:  Continue doing brain stimulating  activities (puzzles, reading, adult coloring books, staying active) to keep memory sharp.   Continue to eat heart healthy diet (full of fruits, vegetables, whole grains, lean protein, water--limit salt, fat, and sugar intake) and increase physical activity as tolerated.  I have personally reviewed and noted the following in the patient's chart:   . Medical and social history . Use of alcohol, tobacco or illicit drugs  . Current medications and supplements . Functional ability and status . Nutritional status . Physical activity . Advanced directives . List  of other physicians . Vitals . Screenings to include cognitive, depression, and falls . Referrals and appointments  In addition, I have reviewed and discussed with patient certain preventive protocols, quality metrics, and best practice recommendations. A written personalized care plan for preventive services as well as general preventive health recommendations were provided to patient.     Michiel Cowboy, RN  07/15/2017  Medical screening examination/treatment/procedure(s) were performed by non-physician practitioner and as supervising physician I was immediately available for consultation/collaboration. I agree with above. [name], MD

## 2017-07-14 NOTE — Progress Notes (Signed)
Pre visit review using our clinic review tool, if applicable. No additional management support is needed unless otherwise documented below in the visit note. 

## 2017-07-15 ENCOUNTER — Ambulatory Visit (INDEPENDENT_AMBULATORY_CARE_PROVIDER_SITE_OTHER): Payer: Medicare Other | Admitting: *Deleted

## 2017-07-15 VITALS — BP 126/68 | HR 63 | Resp 20 | Ht 66.0 in | Wt 149.0 lb

## 2017-07-15 DIAGNOSIS — Z0000 Encounter for general adult medical examination without abnormal findings: Secondary | ICD-10-CM

## 2017-07-15 DIAGNOSIS — H9193 Unspecified hearing loss, bilateral: Secondary | ICD-10-CM

## 2017-07-15 NOTE — Patient Instructions (Signed)
Continue doing brain stimulating activities (puzzles, reading, adult coloring books, staying active) to keep memory sharp.   Continue to eat heart healthy diet (full of fruits, vegetables, whole grains, lean protein, water--limit salt, fat, and sugar intake) and increase physical activity as tolerated.   Nicholas Frye , Thank you for taking time to come for your Medicare Wellness Visit. I appreciate your ongoing commitment to your health goals. Please review the following plan we discussed and let me know if I can assist you in the future.   These are the goals we discussed: Goals    . 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       This is a list of the screening recommended for you and due dates:  Health Maintenance  Topic Date Due  . Flu Shot  06/30/2017  . Tetanus Vaccine  09/15/2023  . Pneumonia vaccines  Completed

## 2017-07-19 NOTE — Addendum Note (Signed)
Addended by: Aviva Signs M on: 07/19/2017 10:08 AM   Modules accepted: Orders

## 2017-07-26 ENCOUNTER — Ambulatory Visit: Payer: Medicare Other | Admitting: Internal Medicine

## 2017-07-27 DIAGNOSIS — H66003 Acute suppurative otitis media without spontaneous rupture of ear drum, bilateral: Secondary | ICD-10-CM | POA: Diagnosis not present

## 2017-07-31 IMAGING — DX DG LUMBAR SPINE COMPLETE 4+V
5 series · 5 of 5 positions shown · non-contrast
Comparison: No recent prior .

CLINICAL DATA: Low back pain.  No known injury.

EXAM:
LUMBAR SPINE - COMPLETE 4+ VIEW

[l-spine ap]
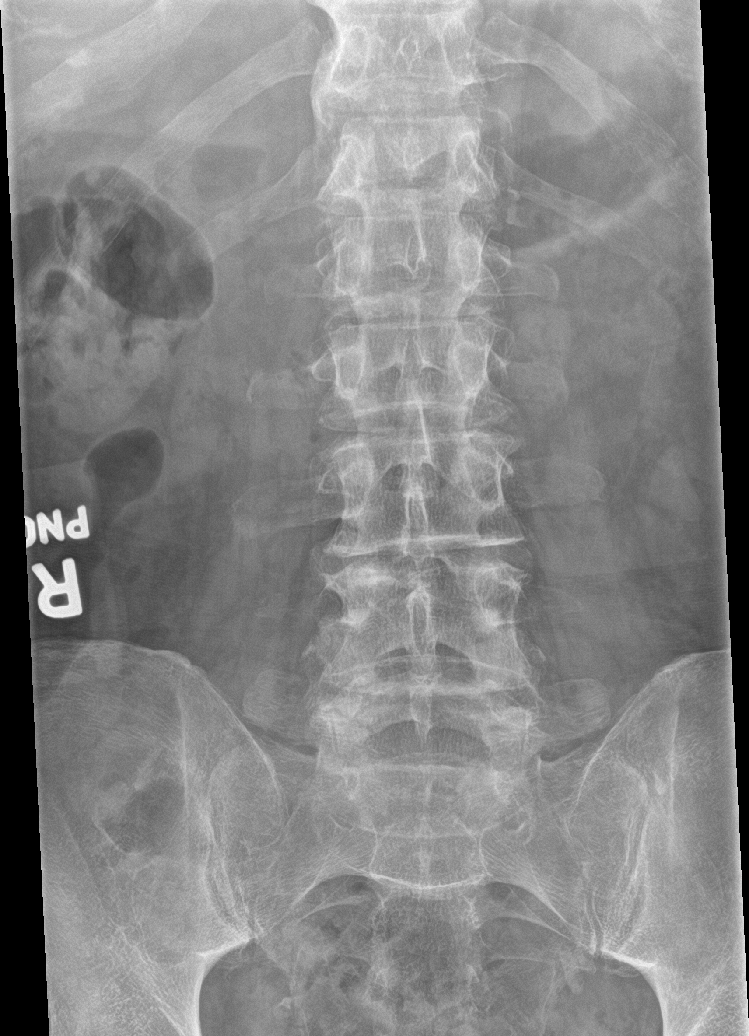

[l-spine obl (1 of 2)]
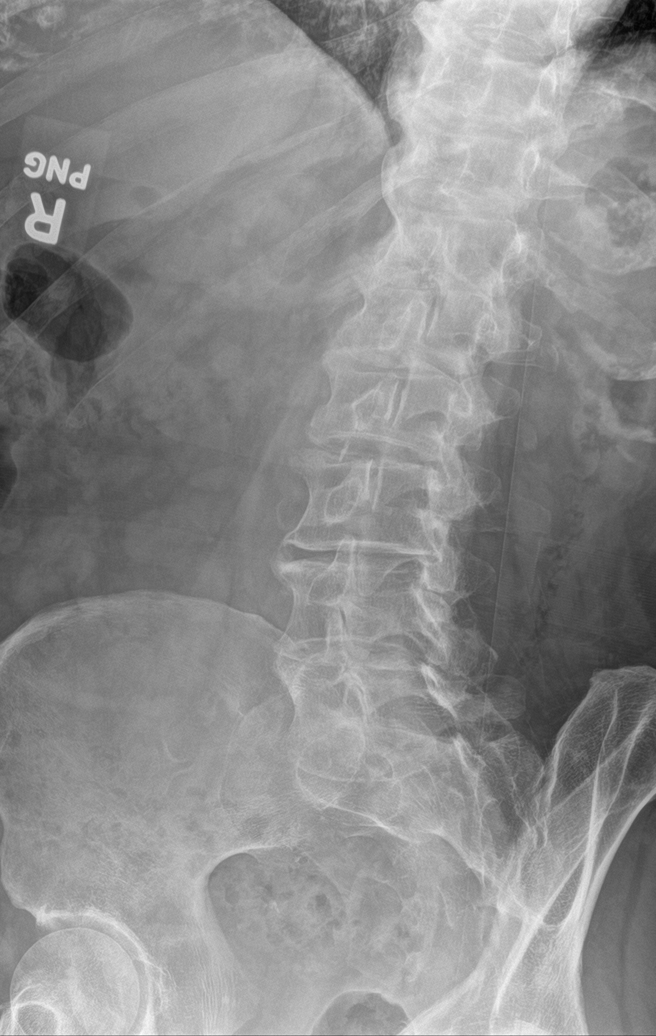

[l-spine obl (2 of 2)]
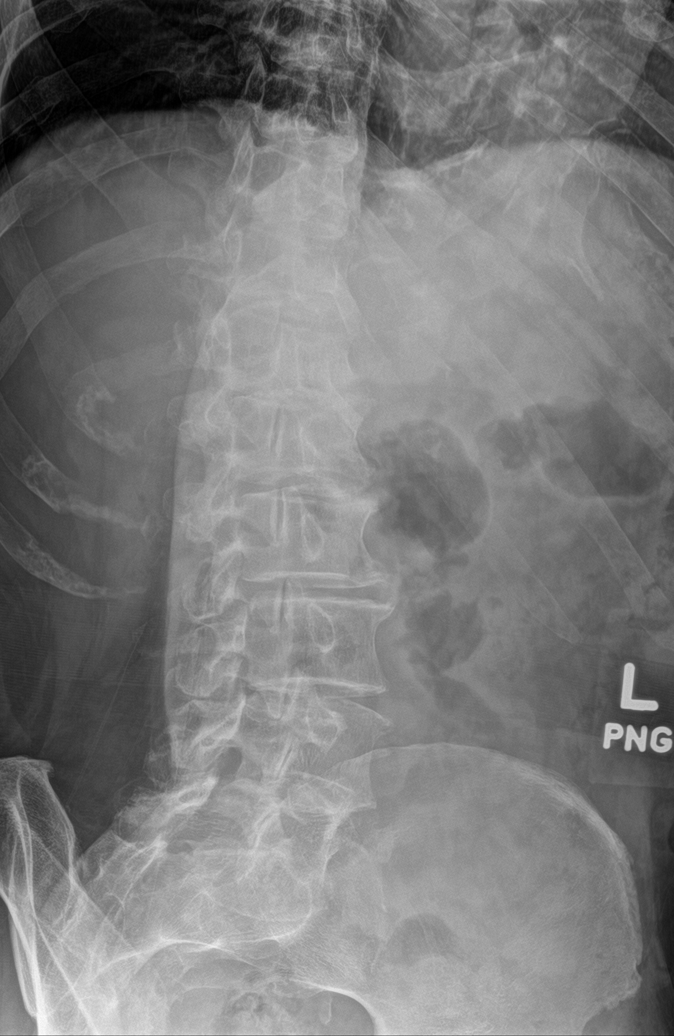

[l-spine lat]
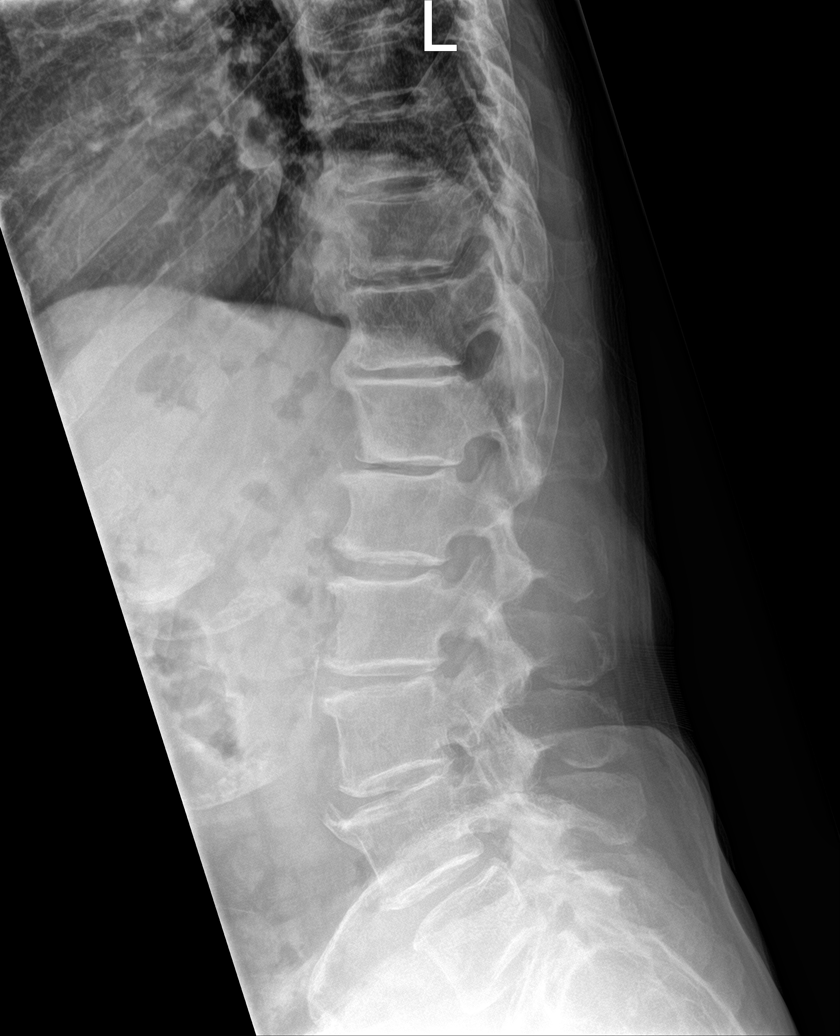

[l-spine spot]
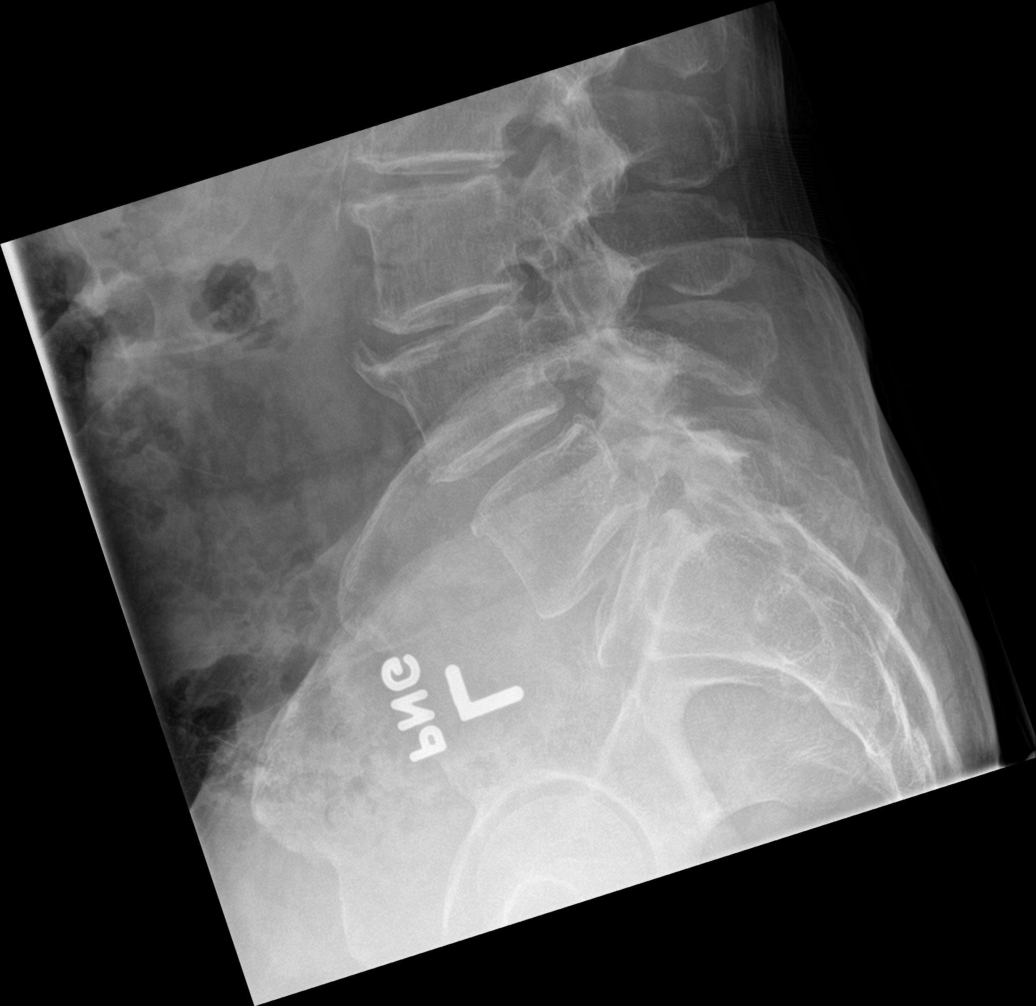

[5 of 5 positions shown; findings below may reference images not displayed]

FINDINGS: Diffuse multilevel degenerative change. No acute abnormality
identified. Three mm retrolisthesis L3 on L4. Similar finding noted
T12-L1.
IMPRESSION: 1. Diffuse multilevel degenerative change. 3 mm retrolisthesis L3 on
L4 and T12 on L1. No acute bony abnormality.

2. Aortoiliac atherosclerotic vascular disease .

## 2017-08-09 ENCOUNTER — Encounter: Payer: Self-pay | Admitting: Internal Medicine

## 2017-08-09 ENCOUNTER — Ambulatory Visit (INDEPENDENT_AMBULATORY_CARE_PROVIDER_SITE_OTHER): Payer: Medicare Other | Admitting: Internal Medicine

## 2017-08-09 ENCOUNTER — Other Ambulatory Visit (INDEPENDENT_AMBULATORY_CARE_PROVIDER_SITE_OTHER): Payer: Medicare Other

## 2017-08-09 VITALS — BP 136/82 | HR 66 | Temp 98.7°F | Resp 16 | Ht 66.0 in | Wt 150.0 lb

## 2017-08-09 DIAGNOSIS — E785 Hyperlipidemia, unspecified: Secondary | ICD-10-CM

## 2017-08-09 DIAGNOSIS — K219 Gastro-esophageal reflux disease without esophagitis: Secondary | ICD-10-CM

## 2017-08-09 DIAGNOSIS — Z0000 Encounter for general adult medical examination without abnormal findings: Secondary | ICD-10-CM

## 2017-08-09 DIAGNOSIS — K581 Irritable bowel syndrome with constipation: Secondary | ICD-10-CM

## 2017-08-09 LAB — CBC WITH DIFFERENTIAL/PLATELET
BASOS ABS: 0 10*3/uL (ref 0.0–0.1)
BASOS PCT: 0.5 % (ref 0.0–3.0)
EOS ABS: 0.1 10*3/uL (ref 0.0–0.7)
Eosinophils Relative: 0.7 % (ref 0.0–5.0)
HEMATOCRIT: 50.3 % (ref 39.0–52.0)
Hemoglobin: 16.5 g/dL (ref 13.0–17.0)
LYMPHS ABS: 1.9 10*3/uL (ref 0.7–4.0)
Lymphocytes Relative: 21.8 % (ref 12.0–46.0)
MCHC: 32.9 g/dL (ref 30.0–36.0)
MCV: 95.2 fl (ref 78.0–100.0)
Monocytes Absolute: 1 10*3/uL (ref 0.1–1.0)
Monocytes Relative: 11.4 % (ref 3.0–12.0)
NEUTROS ABS: 5.7 10*3/uL (ref 1.4–7.7)
NEUTROS PCT: 65.6 % (ref 43.0–77.0)
PLATELETS: 155 10*3/uL (ref 150.0–400.0)
RBC: 5.28 Mil/uL (ref 4.22–5.81)
RDW: 14.1 % (ref 11.5–15.5)
WBC: 8.7 10*3/uL (ref 4.0–10.5)

## 2017-08-09 LAB — COMPREHENSIVE METABOLIC PANEL
ALT: 11 U/L (ref 0–53)
AST: 14 U/L (ref 0–37)
Albumin: 4.2 g/dL (ref 3.5–5.2)
Alkaline Phosphatase: 65 U/L (ref 39–117)
BILIRUBIN TOTAL: 0.8 mg/dL (ref 0.2–1.2)
BUN: 19 mg/dL (ref 6–23)
CHLORIDE: 104 meq/L (ref 96–112)
CO2: 26 meq/L (ref 19–32)
CREATININE: 1.4 mg/dL (ref 0.40–1.50)
Calcium: 9.5 mg/dL (ref 8.4–10.5)
GFR: 51.21 mL/min — ABNORMAL LOW (ref >60.00)
GLUCOSE: 76 mg/dL (ref 70–99)
Potassium: 4.4 mEq/L (ref 3.5–5.1)
SODIUM: 141 meq/L (ref 135–145)
Total Protein: 6.9 g/dL (ref 6.0–8.3)

## 2017-08-09 LAB — LIPID PANEL
CHOL/HDL RATIO: 2
Cholesterol: 192 mg/dL (ref 0–200)
HDL: 86.8 mg/dL (ref >39.00)
LDL CALC: 95 mg/dL (ref 0–99)
NONHDL: 104.87
TRIGLYCERIDES: 48 mg/dL (ref 0.0–149.0)
VLDL: 9.6 mg/dL (ref 0.0–40.0)

## 2017-08-09 LAB — TSH: TSH: 3.48 u[IU]/mL (ref 0.35–4.50)

## 2017-08-09 NOTE — Progress Notes (Signed)
Subjective:  Patient ID: Nicholas Frye, male    DOB: 1930/06/30  Age: 83 y.o. MRN: [id number]  CC: Hyperlipidemia; Gastroesophageal Reflux; and Annual Exam   HPI Nicholas Frye presents for a CPX.  He complains for the last year that he has had intermittent episodes of bilateral lower quadrant abdominal pain. The pain goes away after he passes gas or has a bowel movement. He denies weight loss, loss of appetite, bloody stools, melena, dysuria, or hematuria.  He also complains of intermittent aching in his large joints, especially his thighs. He takes Tylenol for symptom relief.   Past Medical History:  Diagnosis Date  . Elevated prostate specific antigen (PSA)    aged out  . GERD (gastroesophageal reflux disease)   . Lumbago   . Other voice and resonance disorders    damage from reflux  . Personal history of colonic polyps   . Unspecified tinnitus    resolved   Past Surgical History:  Procedure Laterality Date  . CATARACT EXTRACTION Bilateral 11/30/1998  . CATARACT EXTRACTION Bilateral 2000  . CATARACT EXTRACTION W/ INTRAOCULAR LENS  IMPLANT, BILATERAL    . punch biopsy of a lesion on forehead    . TONSILLECTOMY    . transrectal prostate biopsies that were negative      reports that he has never smoked. He has never used smokeless tobacco. He reports that he drinks about 1.0 oz of alcohol per week . He reports that he does not use drugs. family history includes Coronary artery disease in his brother and father; Heart attack in his father; Heart disease in his brother and father; Other in his mother; Peripheral vascular disease in his mother and sister. Allergies  Allergen Reactions  . Yellow Jacket Venom     Outpatient Medications Prior to Visit  Medication Sig Dispense Refill  . aspirin 81 MG tablet Take 81 mg by mouth daily.      . calcium carbonate (TUMS - DOSED IN MG ELEMENTAL CALCIUM) 500 MG chewable tablet Chew 1 tablet by mouth daily.    Marland Kitchen EPINEPHrine  (EPIPEN) 0.3 mg/0.3 mL IJ SOAJ injection Inject 0.3 mLs (0.3 mg total) into the muscle once. 1 Device 3  . Multiple Vitamins-Minerals (PRESERVISION AREDS PO) Take by mouth.     No facility-administered medications prior to visit.     ROS Review of Systems  Constitutional: Negative.  Negative for appetite change, diaphoresis, fatigue and unexpected weight change.  HENT: Negative.  Negative for sore throat and trouble swallowing.   Eyes: Negative for visual disturbance.  Respiratory: Negative for cough, chest tightness, shortness of breath and wheezing.   Cardiovascular: Negative.  Negative for chest pain, palpitations and leg swelling.  Gastrointestinal: Positive for abdominal pain and constipation. Negative for abdominal distention, anal bleeding, nausea and rectal pain.  Endocrine: Negative.   Genitourinary: Negative.  Negative for difficulty urinating, dysuria, flank pain, frequency and urgency.  Musculoskeletal: Positive for arthralgias. Negative for back pain and myalgias.  Skin: Negative.  Negative for color change and rash.  Allergic/Immunologic: Negative.   Neurological: Negative.  Negative for dizziness, weakness and headaches.  Hematological: Negative for adenopathy. Does not bruise/bleed easily.  Psychiatric/Behavioral: Negative.     Objective:  BP 136/82   Pulse 66   Temp 98.7 F (37.1 C) (Oral)   Resp 16   Ht 5\' 6"  (1.676 m)   Wt 150 lb (68 kg)   SpO2 97%   BMI 24.21 kg/m   BP Readings from Last  3 Encounters:  08/09/17 136/82  07/15/17 126/68  04/24/17 (!) 157/84    Wt Readings from Last 3 Encounters:  08/09/17 150 lb (68 kg)  07/15/17 149 lb (67.6 kg)  04/24/17 151 lb (68.5 kg)    Physical Exam  Constitutional: He is oriented to person, place, and time. No distress.  HENT:  Mouth/Throat: Oropharynx is clear and moist. No oropharyngeal exudate.  Eyes: Conjunctivae are normal. Right eye exhibits no discharge. Left eye exhibits no discharge. No scleral  icterus.  Neck: Normal range of motion. Neck supple. No JVD present. No thyromegaly present.  Cardiovascular: Normal rate, regular rhythm and intact distal pulses.  Exam reveals no gallop and no friction rub.   No murmur heard. Pulmonary/Chest: Effort normal and breath sounds normal. No respiratory distress. He has no wheezes. He has no rales. He exhibits no tenderness.  Abdominal: Soft. Bowel sounds are normal. He exhibits no distension and no mass. There is no tenderness. There is no rebound and no guarding.  Genitourinary:  Genitourinary Comments: GU/rectal exams were deferred at his request  Musculoskeletal: Normal range of motion. He exhibits no edema, tenderness or deformity.  Lymphadenopathy:    He has no cervical adenopathy.  Neurological: He is alert and oriented to person, place, and time.  Skin: Skin is warm and dry. No rash noted. He is not diaphoretic. No erythema. No pallor.  Vitals reviewed.   Lab Results  Component Value Date   WBC 8.7 08/09/2017   HGB 16.5 08/09/2017   HCT 50.3 08/09/2017   PLT 155.0 08/09/2017   GLUCOSE 76 08/09/2017   CHOL 192 08/09/2017   TRIG 48.0 08/09/2017   HDL 86.80 08/09/2017   LDLDIRECT 101.3 04/03/2011   LDLCALC 95 08/09/2017   ALT 11 08/09/2017   AST 14 08/09/2017   NA 141 08/09/2017   K 4.4 08/09/2017   CL 104 08/09/2017   CREATININE 1.40 08/09/2017   BUN 19 08/09/2017   CO2 26 08/09/2017   TSH 3.48 08/09/2017   PSA 2.03 04/03/2011    Dg Cervical Spine Complete  Result Date: 04/21/2017 CLINICAL DATA:  83 year old male with left neck pain for 3 weeks and no known injury. EXAM: CERVICAL SPINE - COMPLETE 4+ VIEW COMPARISON:  Brain MRI 05/11/2016. FINDINGS: Normal prevertebral soft tissue contour. Cervicothoracic junction alignment is within normal limits. Bilateral posterior element alignment is within normal limits. Normal cervical AP alignment. Normal C1-C2 alignment and joint spaces. Odontoid and lung apices appear normal. There  is severe disc space loss at C5-C6 with endplate spurring. There is moderate disc space loss with endplate spurring at J1-H4, and to a lesser extent C4-C5. IMPRESSION: 1.  No acute osseous abnormality in the cervical spine. 2. Severe chronic disc degeneration with endplate spurring at R7-E0. Moderate degenerative changes at C6-C7 and C4-C5. Electronically Signed   By: Genevie Ann M.D.   On: 04/21/2017 09:27    Assessment & Plan:   Ianmichael was seen today for hyperlipidemia, gastroesophageal reflux and annual exam.  Diagnoses and all orders for this visit:  Gastroesophageal reflux disease without esophagitis- he has no symptoms related to this and is not being treated. -     CBC with Differential/Platelet; Future  Hyperlipidemia with target LDL less than 100- statin therapy is not indicated. -     Lipid panel; Future -     Comprehensive metabolic panel; Future -     TSH; Future  Irritable bowel syndrome with constipation- he tells me his symptoms are not severe  enough for him to need medical therapy to treat this. He will let me know if he develops any new or worsening symptoms.   I am having Mr. Mickley maintain his aspirin, EPINEPHrine, calcium carbonate, and Multiple Vitamins-Minerals (PRESERVISION AREDS PO).  No orders of the defined types were placed in this encounter.  See AVS for instructions about healthy living and anticipatory guidance.  Follow-up: Return if symptoms worsen or fail to improve.  [name], MD

## 2017-08-09 NOTE — Patient Instructions (Signed)

## 2017-08-10 ENCOUNTER — Encounter: Payer: Self-pay | Admitting: Internal Medicine

## 2017-08-11 DIAGNOSIS — K581 Irritable bowel syndrome with constipation: Secondary | ICD-10-CM | POA: Insufficient documentation

## 2017-08-11 NOTE — Assessment & Plan Note (Signed)
He refused a flu vaccine today  The written screening recommendations is given to patient and attached in the patent instructions or AVS.   The patient is here for annual Medicare wellness examination and management of other chronic and acute problems.   The risk factors are reflected in the social history.  The roster of all physicians providing medical care to patient - is listed in the Snapshot section of the chart.  Activities of daily living:  The patient is 100% inedpendent in all ADLs: dressing, toileting, feeding as well as independent mobility  Home safety : The patient has smoke detectors in the home. They wear seatbelts.No firearms at home ( firearms are present in the home, kept in a safe fashion). There is no violence in the home.   There is no risks for hepatitis, STDs or HIV. There is no   history of blood transfusion. They have no travel history to infectious disease endemic areas of the world.  The patient has (has not) seen their dentist in the last six month. They have (not) seen their eye doctor in the last year. They deny (admit to) any hearing difficulty and have not had audiologic testing in the last year.  They do not  have excessive sun exposure. Discussed the need for sun protection: hats, long sleeves and use of sunscreen if there is significant sun exposure.   Diet: the importance of a healthy diet is discussed. They do have a healthy (unhealthy-high fat/fast food) diet.  The patient has a regular exercise program.  The benefits of regular aerobic exercise were discussed.  Depression screen: there are no signs or vegative symptoms of depression- irritability, change in appetite, anhedonia, sadness/tearfullness.  Cognitive assessment: the patient manages all their financial and personal affairs and is actively engaged. They could relate day,date,year and events; recalled 3/3 objects at 3 minutes; performed clock-face test normally.  The following portions of the  patient's history were reviewed and updated as appropriate: allergies, current medications, past family history, past medical history,  past surgical history, past social history  and problem list.  Vision, hearing, body mass index were assessed and reviewed.   During the course of the visit the patient was educated and counseled about appropriate screening and preventive services including : fall prevention , diabetes screening, nutrition counseling, colorectal cancer screening, and recommended immunizations.

## 2017-08-19 DIAGNOSIS — H903 Sensorineural hearing loss, bilateral: Secondary | ICD-10-CM | POA: Diagnosis not present

## 2017-09-20 DIAGNOSIS — H53431 Sector or arcuate defects, right eye: Secondary | ICD-10-CM | POA: Diagnosis not present

## 2017-09-20 DIAGNOSIS — H353132 Nonexudative age-related macular degeneration, bilateral, intermediate dry stage: Secondary | ICD-10-CM | POA: Diagnosis not present

## 2017-09-20 DIAGNOSIS — R94112 Abnormal visually evoked potential [VEP]: Secondary | ICD-10-CM | POA: Diagnosis not present

## 2018-03-14 DIAGNOSIS — H353132 Nonexudative age-related macular degeneration, bilateral, intermediate dry stage: Secondary | ICD-10-CM | POA: Diagnosis not present

## 2018-03-14 DIAGNOSIS — H35363 Drusen (degenerative) of macula, bilateral: Secondary | ICD-10-CM | POA: Diagnosis not present

## 2018-05-17 ENCOUNTER — Telehealth: Payer: Self-pay | Admitting: Emergency Medicine

## 2018-05-17 NOTE — Telephone Encounter (Signed)
Called patient to schedule AWV. Pt will call back to schedule at a later date.

## 2018-08-31 DIAGNOSIS — S63610A Unspecified sprain of right index finger, initial encounter: Secondary | ICD-10-CM | POA: Diagnosis not present

## 2018-08-31 DIAGNOSIS — S62600A Fracture of unspecified phalanx of right index finger, initial encounter for closed fracture: Secondary | ICD-10-CM | POA: Diagnosis not present

## 2018-09-12 DIAGNOSIS — H353132 Nonexudative age-related macular degeneration, bilateral, intermediate dry stage: Secondary | ICD-10-CM | POA: Diagnosis not present

## 2018-09-12 DIAGNOSIS — H35372 Puckering of macula, left eye: Secondary | ICD-10-CM | POA: Diagnosis not present

## 2018-09-12 DIAGNOSIS — Z961 Presence of intraocular lens: Secondary | ICD-10-CM | POA: Diagnosis not present

## 2018-10-13 ENCOUNTER — Other Ambulatory Visit (INDEPENDENT_AMBULATORY_CARE_PROVIDER_SITE_OTHER): Payer: Medicare Other

## 2018-10-13 ENCOUNTER — Ambulatory Visit (INDEPENDENT_AMBULATORY_CARE_PROVIDER_SITE_OTHER)
Admission: RE | Admit: 2018-10-13 | Discharge: 2018-10-13 | Disposition: A | Discharge status: Home / Self Care | Payer: Medicare Other | Source: Ambulatory Visit | Attending: Internal Medicine | Admitting: Internal Medicine

## 2018-10-13 ENCOUNTER — Encounter: Payer: Self-pay | Admitting: Internal Medicine

## 2018-10-13 ENCOUNTER — Ambulatory Visit (INDEPENDENT_AMBULATORY_CARE_PROVIDER_SITE_OTHER): Payer: Medicare Other | Admitting: Internal Medicine

## 2018-10-13 VITALS — BP 142/82 | HR 80 | Temp 98.0°F | Ht 66.0 in | Wt 149.2 lb

## 2018-10-13 DIAGNOSIS — M7989 Other specified soft tissue disorders: Secondary | ICD-10-CM | POA: Diagnosis not present

## 2018-10-13 DIAGNOSIS — E785 Hyperlipidemia, unspecified: Secondary | ICD-10-CM

## 2018-10-13 DIAGNOSIS — K219 Gastro-esophageal reflux disease without esophagitis: Secondary | ICD-10-CM | POA: Diagnosis not present

## 2018-10-13 DIAGNOSIS — S6991XA Unspecified injury of right wrist, hand and finger(s), initial encounter: Secondary | ICD-10-CM | POA: Diagnosis not present

## 2018-10-13 DIAGNOSIS — M20009 Unspecified deformity of unspecified finger(s): Secondary | ICD-10-CM | POA: Insufficient documentation

## 2018-10-13 DIAGNOSIS — Z0001 Encounter for general adult medical examination with abnormal findings: Secondary | ICD-10-CM

## 2018-10-13 DIAGNOSIS — Z23 Encounter for immunization: Secondary | ICD-10-CM | POA: Diagnosis not present

## 2018-10-13 DIAGNOSIS — Z0000 Encounter for general adult medical examination without abnormal findings: Secondary | ICD-10-CM

## 2018-10-13 LAB — COMPREHENSIVE METABOLIC PANEL
ALT: 12 U/L (ref 0–53)
AST: 16 U/L (ref 0–37)
Albumin: 4.1 g/dL (ref 3.5–5.2)
Alkaline Phosphatase: 71 U/L (ref 39–117)
BUN: 20 mg/dL (ref 6–23)
CALCIUM: 9.5 mg/dL (ref 8.4–10.5)
CHLORIDE: 104 meq/L (ref 96–112)
CO2: 28 mEq/L (ref 19–32)
CREATININE: 1.33 mg/dL (ref 0.40–1.50)
GFR: 54.18 mL/min — ABNORMAL LOW (ref >60.00)
GLUCOSE: 104 mg/dL — AB (ref 70–99)
Potassium: 4.3 mEq/L (ref 3.5–5.1)
SODIUM: 141 meq/L (ref 135–145)
Total Bilirubin: 0.8 mg/dL (ref 0.2–1.2)
Total Protein: 7 g/dL (ref 6.0–8.3)

## 2018-10-13 LAB — TSH: TSH: 3.43 u[IU]/mL (ref 0.35–4.50)

## 2018-10-13 LAB — CBC WITH DIFFERENTIAL/PLATELET
BASOS ABS: 0.1 10*3/uL (ref 0.0–0.1)
BASOS PCT: 0.7 % (ref 0.0–3.0)
Eosinophils Absolute: 0.2 10*3/uL (ref 0.0–0.7)
Eosinophils Relative: 2.9 % (ref 0.0–5.0)
HCT: 47.7 % (ref 39.0–52.0)
Hemoglobin: 16.1 g/dL (ref 13.0–17.0)
LYMPHS ABS: 1.4 10*3/uL (ref 0.7–4.0)
LYMPHS PCT: 19.7 % (ref 12.0–46.0)
MCHC: 33.8 g/dL (ref 30.0–36.0)
MCV: 94 fl (ref 78.0–100.0)
MONO ABS: 0.7 10*3/uL (ref 0.1–1.0)
Monocytes Relative: 9.4 % (ref 3.0–12.0)
NEUTROS ABS: 4.9 10*3/uL (ref 1.4–7.7)
Neutrophils Relative %: 67.3 % (ref 43.0–77.0)
Platelets: 167 10*3/uL (ref 150.0–400.0)
RBC: 5.07 Mil/uL (ref 4.22–5.81)
RDW: 13.7 % (ref 11.5–15.5)
WBC: 7.3 10*3/uL (ref 4.0–10.5)

## 2018-10-13 LAB — LIPID PANEL
CHOL/HDL RATIO: 2
Cholesterol: 193 mg/dL (ref 0–200)
HDL: 89.1 mg/dL (ref >39.00)
LDL Cholesterol: 93 mg/dL (ref 0–99)
NonHDL: 103.51
TRIGLYCERIDES: 51 mg/dL (ref 0.0–149.0)
VLDL: 10.2 mg/dL (ref 0.0–40.0)

## 2018-10-13 NOTE — Patient Instructions (Signed)

## 2018-10-13 NOTE — Progress Notes (Signed)
Subjective:  Patient ID: Nicholas Frye, male    DOB: [birth year]  Age: 84 y.o. MRN: [id number]  CC: Annual Exam and Hand Pain   HPI [name] presents for a CPX.   He complains of pain, swelling, and decreased range of motion in his right index finger over the PIP joint.  He tells me he fell and injured about a month ago.  He tells me he was seen in urgent care center and was told that he did not have a broken bone.  He is concerned because it is not healing very quickly.  He offers no other complaints today.   Outpatient Medications Prior to Visit  Medication Sig Dispense Refill  . EPINEPHrine (EPIPEN) 0.3 mg/0.3 mL IJ SOAJ injection Inject 0.3 mLs (0.3 mg total) into the muscle once. 1 Device 3  . Multiple Vitamins-Minerals (PRESERVISION AREDS PO) Take by mouth.    Marland Kitchen aspirin 81 MG tablet Take 81 mg by mouth daily.      . calcium carbonate (TUMS - DOSED IN MG ELEMENTAL CALCIUM) 500 MG chewable tablet Chew 1 tablet by mouth daily.     No facility-administered medications prior to visit.     ROS Review of Systems  Constitutional: Negative for diaphoresis, fatigue and unexpected weight change.  HENT: Negative.   Eyes: Negative for visual disturbance.  Respiratory: Negative for cough, chest tightness, shortness of breath and wheezing.   Cardiovascular: Negative for chest pain, palpitations and leg swelling.  Gastrointestinal: Negative for abdominal pain, constipation, diarrhea, nausea and vomiting.  Endocrine: Negative.   Genitourinary: Negative.  Negative for difficulty urinating, penile swelling, scrotal swelling, testicular pain and urgency.  Musculoskeletal: Positive for arthralgias. Negative for back pain and myalgias.  Skin: Negative.  Negative for color change and pallor.  Neurological: Negative.  Negative for dizziness and weakness.  Hematological: Negative for adenopathy. Does not bruise/bleed easily.  Psychiatric/Behavioral: Negative.     Objective:  BP  (!) 142/82 (BP Location: Left Arm, Patient Position: Sitting, Cuff Size: Normal)   Pulse 80   Temp 98 F (36.7 C) (Oral)   Ht 5' 6"  (1.676 m)   Wt 149 lb 4 oz (67.7 kg)   SpO2 98%   BMI 24.09 kg/m   BP Readings from Last 3 Encounters:  10/13/18 (!) 142/82  08/09/17 136/82  07/15/17 126/68    Wt Readings from Last 3 Encounters:  10/13/18 149 lb 4 oz (67.7 kg)  08/09/17 150 lb (68 kg)  07/15/17 149 lb (67.6 kg)    Physical Exam  Constitutional: He is oriented to person, place, and time. No distress.  HENT:  Mouth/Throat: Oropharynx is clear and moist. No oropharyngeal exudate.  Eyes: Conjunctivae are normal. No scleral icterus.  Neck: Normal range of motion. Neck supple. No JVD present. No thyromegaly present.  Cardiovascular: Normal rate, regular rhythm and normal heart sounds. Exam reveals no gallop.  No murmur heard. Pulses:      Carotid pulses are 1+ on the right side, and 1+ on the left side.      Radial pulses are 1+ on the right side, and 1+ on the left side.       Femoral pulses are 1+ on the right side, and 1+ on the left side.      Popliteal pulses are 1+ on the right side, and 1+ on the left side.       Dorsalis pedis pulses are 1+ on the right side, and 1+ on the left side.  Posterior tibial pulses are 1+ on the right side, and 1+ on the left side.  Pulmonary/Chest: Effort normal and breath sounds normal. No respiratory distress. He has no wheezes. He has no rales.  Abdominal: Soft. Bowel sounds are normal. He exhibits no mass. There is no hepatosplenomegaly. There is no tenderness.  Genitourinary:  Genitourinary Comments: GU/rectal/prostate exams were deferred at his request.  Musculoskeletal: Normal range of motion. He exhibits no edema, tenderness or deformity.       Hands: Lymphadenopathy:    He has no cervical adenopathy.  Neurological: He is alert and oriented to person, place, and time.  Skin: Skin is warm and dry. No rash noted. He is not  diaphoretic. No erythema.  Vitals reviewed.   Lab Results  Component Value Date   WBC 7.3 10/13/2018   HGB 16.1 10/13/2018   HCT 47.7 10/13/2018   PLT 167.0 10/13/2018   GLUCOSE 104 (H) 10/13/2018   CHOL 193 10/13/2018   TRIG 51.0 10/13/2018   HDL 89.10 10/13/2018   LDLDIRECT 101.3 04/03/2011   LDLCALC 93 10/13/2018   ALT 12 10/13/2018   AST 16 10/13/2018   NA 141 10/13/2018   K 4.3 10/13/2018   CL 104 10/13/2018   CREATININE 1.33 10/13/2018   BUN 20 10/13/2018   CO2 28 10/13/2018   TSH 3.43 10/13/2018   PSA 2.03 04/03/2011    Dg Cervical Spine Complete  Result Date: 04/21/2017 CLINICAL DATA:  84 year old male with left neck pain for 3 weeks and no known injury. EXAM: CERVICAL SPINE - COMPLETE 4+ VIEW COMPARISON:  Brain MRI 05/11/2016. FINDINGS: Normal prevertebral soft tissue contour. Cervicothoracic junction alignment is within normal limits. Bilateral posterior element alignment is within normal limits. Normal cervical AP alignment. Normal C1-C2 alignment and joint spaces. Odontoid and lung apices appear normal. There is severe disc space loss at C5-C6 with endplate spurring. There is moderate disc space loss with endplate spurring at J3-H5, and to a lesser extent C4-C5. IMPRESSION: 1.  No acute osseous abnormality in the cervical spine. 2. Severe chronic disc degeneration with endplate spurring at K5-G2. Moderate degenerative changes at C6-C7 and C4-C5. Electronically Signed   By: Genevie Ann M.D.   On: 04/21/2017 09:27    Assessment & Plan:   Blaine was seen today for annual exam and hand pain.  Diagnoses and all orders for this visit:  Hyperlipidemia with target LDL less than 100- Statin therapy is not indicated. -     TSH; Future -     Comp Met (CMET); Future -     Lipid panel; Future  Routine health maintenance  Gastroesophageal reflux disease without esophagitis- He has no symptoms or complications related to this. -     CBC with Differential/Platelet;  Future  Need for influenza vaccination -     Flu vaccine HIGH DOSE PF (Fluzone High dose)  Acquired deformity of finger due to trauma- Plain film shows a small avulsion fracture.  I have asked him to see an orthopedic hand surgeon about this. -     DG Finger Index Right; Future -     Ambulatory referral to Orthopedic Surgery  Need for pneumococcal vaccination -     Pneumococcal polysaccharide vaccine 23-valent greater than or equal to 2yo subcutaneous/IM   I have discontinued [name]'s aspirin and calcium carbonate. I am also having him maintain his EPINEPHrine and Multiple Vitamins-Minerals (PRESERVISION AREDS PO).  No orders of the defined types were placed in this encounter.  Follow-up: Return if symptoms worsen or fail to improve.  [name], MD

## 2018-11-01 ENCOUNTER — Encounter: Payer: Self-pay | Admitting: Internal Medicine

## 2018-11-03 ENCOUNTER — Ambulatory Visit (INDEPENDENT_AMBULATORY_CARE_PROVIDER_SITE_OTHER): Payer: Self-pay

## 2018-11-03 ENCOUNTER — Encounter (INDEPENDENT_AMBULATORY_CARE_PROVIDER_SITE_OTHER): Payer: Self-pay | Admitting: Orthopaedic Surgery

## 2018-11-03 ENCOUNTER — Ambulatory Visit (INDEPENDENT_AMBULATORY_CARE_PROVIDER_SITE_OTHER): Payer: Medicare Other | Admitting: Orthopaedic Surgery

## 2018-11-03 DIAGNOSIS — M79644 Pain in right finger(s): Secondary | ICD-10-CM | POA: Diagnosis not present

## 2018-11-03 NOTE — Progress Notes (Signed)
Office Visit Note   Patient: Nicholas Frye           Date of Birth: 08-02-1930           MRN: [id number] Visit Date: 11/03/2018              Requested by: Janith Lima, MD [address] [place], [place] [address] PCP: Janith Lima, MD   Assessment & Plan: Visit Diagnoses:  1. Pain in right finger(s)     Plan: He understands this will take quite some time for all the swelling subside in his index finger.  I recommended he get some type of ball that he can squeeze and that will help with getting his motion back.  I do not feel that he needs hand therapy and he does not feel like he needs it as well.  All question concerns were answered and addressed.  Follow-up can be as needed.  Follow-Up Instructions: Return if symptoms worsen or fail to improve.   Orders:  Orders Placed This Encounter  Procedures  . XR Finger Index Right   No orders of the defined types were placed in this encounter.     Procedures: No procedures performed   Clinical Data: No additional findings.   Subjective: Chief Complaint  Patient presents with  . Right Hand - Injury  The patient is very pleasant and active 84 year old gentleman who reports a finger dislocation of his right dominant hand index finger about 6 weeks ago.  It is still been swollen since then.  He said the injury happened when he was at home and he pulled it back into place.  He had x-rays several weeks ago of this finger.  He still had a lot of swelling and difficult to get making a full fist or pinching with that hand.  Denies any numbness and tingling or other injuries.  He is never injured this 1 before.  HPI  Review of Systems He currently denies any headache, chest pain, shortness of breath, fever, chills, nausea, vomiting.  Objective: Vital Signs: There were no vitals taken for this visit.  Physical Exam He is alert and oriented x3 and in no acute distress Ortho Exam Examination of his right hand  shows swelling at the PIP joint of the index finger.  Clinically is well located and feels ligamentously stable.  His range of motion is limited due to swelling.  He is neurovascular intact.  He has difficulty pinching and gripping due to the index finger swelling. Specialty Comments:  No specialty comments available.  Imaging: Xr Finger Index Right  Result Date: 11/03/2018 3 views of the right index finger show no dislocation of the IP joints or the MCP joint.  The alignment is anatomic.  There is abundant soft tissue swelling around the PIP joint of the index finger.    PMFS History: Patient Active Problem List   Diagnosis Date Noted  . Acquired deformity of finger due to trauma 10/13/2018  . Irritable bowel syndrome with constipation 08/11/2017  . DDD (degenerative disc disease), lumbar 07/27/2016  . GERD (gastroesophageal reflux disease) 03/01/2014  . Hyperlipidemia with target LDL less than 100 03/01/2014  . Routine health maintenance 02/15/2012  . PVD 04/30/2008   Past Medical History:  Diagnosis Date  . Elevated prostate specific antigen (PSA)    aged out  . GERD (gastroesophageal reflux disease)   . Lumbago   . Other voice and resonance disorders    damage from  reflux  . Personal history of colonic polyps   . Unspecified tinnitus    resolved    Family History  Problem Relation Age of Onset  . Peripheral vascular disease Mother   . Other Mother        bilateral Leg amputation  . Coronary artery disease Father   . Heart attack Father   . Heart disease Father        heart failure/ acute MI  . Peripheral vascular disease Sister   . Coronary artery disease Brother        valve replaced  . Heart disease Brother        MI x2, CABG, AVR  . Cancer Neg Hx        colon or prostate  . Diabetes Neg Hx     Past Surgical History:  Procedure Laterality Date  . CATARACT EXTRACTION Bilateral 11/30/1998  . CATARACT EXTRACTION Bilateral 2000  . CATARACT EXTRACTION W/  INTRAOCULAR LENS  IMPLANT, BILATERAL    . punch biopsy of a lesion on forehead    . TONSILLECTOMY    . transrectal prostate biopsies that were negative     Social History   Occupational History    Comment: retired-still works at [employer]  . Smoking status: Never Smoker  . Smokeless tobacco: Never Used  Substance and Sexual Activity  . Alcohol use: Yes    Alcohol/week: 2.0 standard drinks    Types: 2 Standard drinks or equivalent per week    Comment: cocktail before dinner  . Drug use: No  . Sexual activity: Never

## 2019-04-27 DIAGNOSIS — H35372 Puckering of macula, left eye: Secondary | ICD-10-CM | POA: Diagnosis not present

## 2019-04-27 DIAGNOSIS — Z961 Presence of intraocular lens: Secondary | ICD-10-CM | POA: Diagnosis not present

## 2019-04-27 DIAGNOSIS — H35363 Drusen (degenerative) of macula, bilateral: Secondary | ICD-10-CM | POA: Diagnosis not present

## 2019-04-27 DIAGNOSIS — H353132 Nonexudative age-related macular degeneration, bilateral, intermediate dry stage: Secondary | ICD-10-CM | POA: Diagnosis not present

## 2019-07-27 ENCOUNTER — Encounter: Payer: Self-pay | Admitting: Internal Medicine

## 2019-08-17 ENCOUNTER — Encounter: Payer: Self-pay | Admitting: Internal Medicine

## 2019-08-17 ENCOUNTER — Other Ambulatory Visit: Payer: Self-pay

## 2019-08-17 ENCOUNTER — Other Ambulatory Visit (INDEPENDENT_AMBULATORY_CARE_PROVIDER_SITE_OTHER): Payer: Medicare Other

## 2019-08-17 ENCOUNTER — Ambulatory Visit (INDEPENDENT_AMBULATORY_CARE_PROVIDER_SITE_OTHER): Payer: Medicare Other | Admitting: Internal Medicine

## 2019-08-17 VITALS — BP 132/82 | HR 70 | Temp 98.4°F | Resp 16 | Ht 66.0 in | Wt 147.2 lb

## 2019-08-17 DIAGNOSIS — R739 Hyperglycemia, unspecified: Secondary | ICD-10-CM | POA: Diagnosis not present

## 2019-08-17 DIAGNOSIS — H6123 Impacted cerumen, bilateral: Secondary | ICD-10-CM | POA: Diagnosis not present

## 2019-08-17 DIAGNOSIS — E785 Hyperlipidemia, unspecified: Secondary | ICD-10-CM | POA: Diagnosis not present

## 2019-08-17 DIAGNOSIS — Z23 Encounter for immunization: Secondary | ICD-10-CM

## 2019-08-17 LAB — TSH: TSH: 3.58 u[IU]/mL (ref 0.35–4.50)

## 2019-08-17 LAB — LIPID PANEL
Cholesterol: 190 mg/dL (ref 0–200)
HDL: 89.1 mg/dL (ref >39.00)
LDL Cholesterol: 93 mg/dL (ref 0–99)
NonHDL: 101.1
Total CHOL/HDL Ratio: 2
Triglycerides: 43 mg/dL (ref 0.0–149.0)
VLDL: 8.6 mg/dL (ref 0.0–40.0)

## 2019-08-17 LAB — HEPATIC FUNCTION PANEL
ALT: 11 U/L (ref 0–53)
AST: 16 U/L (ref 0–37)
Albumin: 4.2 g/dL (ref 3.5–5.2)
Alkaline Phosphatase: 80 U/L (ref 39–117)
Bilirubin, Direct: 0.1 mg/dL (ref 0.0–0.3)
Total Bilirubin: 1 mg/dL (ref 0.2–1.2)
Total Protein: 7.3 g/dL (ref 6.0–8.3)

## 2019-08-17 LAB — HEMOGLOBIN A1C: Hgb A1c MFr Bld: 5.3 % (ref 4.6–6.5)

## 2019-08-17 LAB — BASIC METABOLIC PANEL
BUN: 19 mg/dL (ref 6–23)
CO2: 27 mEq/L (ref 19–32)
Calcium: 9.5 mg/dL (ref 8.4–10.5)
Chloride: 104 mEq/L (ref 96–112)
Creatinine, Ser: 1.22 mg/dL (ref 0.40–1.50)
GFR: 56.21 mL/min — ABNORMAL LOW (ref >60.00)
Glucose, Bld: 85 mg/dL (ref 70–99)
Potassium: 4.2 mEq/L (ref 3.5–5.1)
Sodium: 140 mEq/L (ref 135–145)

## 2019-08-17 NOTE — Progress Notes (Signed)
Patient consent obtained. Irrigation with water and peroxide performed. Full view of bilateral tympanic membranes after procedure.  Patient tolerated procedure well.   

## 2019-08-17 NOTE — Patient Instructions (Signed)

## 2019-08-17 NOTE — Progress Notes (Signed)
Subjective:  Patient ID: Nicholas Frye, male    DOB: 11/01/1930  Age: 85 y.o. MRN: [id number]  CC: Follow-up   HPI Nicholas Frye presents for f/up - He complains of a several week history of decreased level of hearing.  He denies earaches, dizziness, vertigo, rash, or lymphadenopathy.  Outpatient Medications Prior to Visit  Medication Sig Dispense Refill  . Multiple Vitamins-Minerals (PRESERVISION AREDS PO) Take by mouth.    . EPINEPHrine (EPIPEN) 0.3 mg/0.3 mL IJ SOAJ injection Inject 0.3 mLs (0.3 mg total) into the muscle once. (Patient not taking: Reported on 08/17/2019) 1 Device 3   No facility-administered medications prior to visit.     ROS Review of Systems  Constitutional: Negative for chills, diaphoresis, fatigue and fever.  HENT: Positive for hearing loss. Negative for ear discharge, ear pain, sore throat and trouble swallowing.   Eyes: Negative.   Respiratory: Negative for cough, chest tightness, shortness of breath and wheezing.   Cardiovascular: Negative for chest pain, palpitations and leg swelling.  Gastrointestinal: Negative for abdominal pain, constipation, diarrhea, nausea and vomiting.  Endocrine: Negative.   Genitourinary: Negative.  Negative for difficulty urinating.  Musculoskeletal: Negative.  Negative for arthralgias and myalgias.  Skin: Negative.  Negative for color change and rash.  Neurological: Negative.  Negative for dizziness, weakness and headaches.  Hematological: Negative for adenopathy. Does not bruise/bleed easily.  Psychiatric/Behavioral: Negative.     Objective:  BP 132/82 (BP Location: Left Arm, Patient Position: Sitting, Cuff Size: Normal)   Pulse 70   Temp 98.4 F (36.9 C) (Oral)   Resp 16   Ht 5\' 6"  (1.676 m)   Wt 147 lb 4 oz (66.8 kg)   SpO2 98%   BMI 23.77 kg/m   BP Readings from Last 3 Encounters:  08/17/19 132/82  10/13/18 (!) 142/82  08/09/17 136/82    Wt Readings from Last 3 Encounters:  08/17/19 147 lb 4  oz (66.8 kg)  10/13/18 149 lb 4 oz (67.7 kg)  08/09/17 150 lb (68 kg)    Physical Exam Vitals signs reviewed.  Constitutional:      Appearance: Normal appearance. He is not ill-appearing or diaphoretic.  HENT:     Right Ear: Tympanic membrane, ear canal and external ear normal. Decreased hearing noted. There is impacted cerumen.     Left Ear: Tympanic membrane, ear canal and external ear normal. Decreased hearing noted. There is impacted cerumen.     Ears:     Comments: I put Colace in both ears and then I irrigated them and used an ear pick to remove the cerumen.  He tolerated this well.  The exam afterwards is within normal limits.    Nose: Nose normal. No congestion.     Mouth/Throat:     Mouth: Mucous membranes are moist.  Eyes:     General: No scleral icterus.    Conjunctiva/sclera: Conjunctivae normal.  Neck:     Musculoskeletal: Normal range of motion.  Cardiovascular:     Rate and Rhythm: Normal rate and regular rhythm.     Heart sounds: No murmur.  Pulmonary:     Effort: Pulmonary effort is normal.     Breath sounds: No stridor. No wheezing, rhonchi or rales.  Abdominal:     General: Abdomen is flat. Bowel sounds are normal. There is no distension.     Palpations: There is no hepatomegaly or splenomegaly.     Tenderness: There is no abdominal tenderness.  Musculoskeletal: Normal range of motion.  Right lower leg: No edema.     Left lower leg: No edema.  Lymphadenopathy:     Cervical: No cervical adenopathy.  Skin:    General: Skin is warm and dry.     Findings: No rash.  Neurological:     General: No focal deficit present.     Mental Status: He is alert.  Psychiatric:        Mood and Affect: Mood normal.        Behavior: Behavior normal.     Lab Results  Component Value Date   WBC 7.3 10/13/2018   HGB 16.1 10/13/2018   HCT 47.7 10/13/2018   PLT 167.0 10/13/2018   GLUCOSE 85 08/17/2019   CHOL 190 08/17/2019   TRIG 43.0 08/17/2019   HDL 89.10  08/17/2019   LDLDIRECT 101.3 04/03/2011   LDLCALC 93 08/17/2019   ALT 11 08/17/2019   AST 16 08/17/2019   NA 140 08/17/2019   K 4.2 08/17/2019   CL 104 08/17/2019   CREATININE 1.22 08/17/2019   BUN 19 08/17/2019   CO2 27 08/17/2019   TSH 3.58 08/17/2019   PSA 2.03 04/03/2011   HGBA1C 5.3 08/17/2019    Dg Finger Index Right  Result Date: 10/13/2018 CLINICAL DATA:  Fall.  Index finger injury. EXAM: RIGHT INDEX FINGER 2+V COMPARISON:  No prior. FINDINGS: Diffuse soft tissue swelling. Tiny bony density noted posterior to the distal aspect of the proximal phalanx of the right second digit. This could represent tiny fracture chip. No other focal abnormality identified. IMPRESSION: Diffuse soft tissue swelling. Tiny bony density noted along the posterior aspect of the distal phalanx of the right second digit. This could represent a tiny fracture chip. Electronically Signed   By: Marcello Moores  Register   On: 10/13/2018 12:50    Assessment & Plan:   Fon was seen today for follow-up.  Diagnoses and all orders for this visit:  Need for influenza vaccination -     Flu Vaccine QUAD High Dose(Fluad)  Hearing loss due to cerumen impaction, bilateral- Cerumen impaction adequately treated.  Hyperlipidemia with target LDL less than 100- Statin therapy is not indicated. -     Lipid panel; Future -     TSH; Future -     Hepatic function panel; Future -     Basic metabolic panel; Future  Hyperglycemia- His blood sugars are normal now. -     Hemoglobin A1c; Future -     Basic metabolic panel; Future   I am having [name] maintain his EPINEPHrine and Multiple Vitamins-Minerals (PRESERVISION AREDS PO).  No orders of the defined types were placed in this encounter.    Follow-up: Return in about 6 months (around 02/14/2020).  [name], MD

## 2019-09-14 ENCOUNTER — Other Ambulatory Visit: Payer: Self-pay

## 2019-09-14 DIAGNOSIS — Z20822 Contact with and (suspected) exposure to covid-19: Secondary | ICD-10-CM

## 2019-09-16 LAB — NOVEL CORONAVIRUS, NAA: SARS-CoV-2, NAA: NOT DETECTED

## 2019-10-16 DIAGNOSIS — H35372 Puckering of macula, left eye: Secondary | ICD-10-CM | POA: Diagnosis not present

## 2019-10-16 DIAGNOSIS — H353132 Nonexudative age-related macular degeneration, bilateral, intermediate dry stage: Secondary | ICD-10-CM | POA: Diagnosis not present

## 2019-10-16 DIAGNOSIS — Z961 Presence of intraocular lens: Secondary | ICD-10-CM | POA: Diagnosis not present

## 2020-04-08 DIAGNOSIS — Z961 Presence of intraocular lens: Secondary | ICD-10-CM | POA: Diagnosis not present

## 2020-04-08 DIAGNOSIS — H353132 Nonexudative age-related macular degeneration, bilateral, intermediate dry stage: Secondary | ICD-10-CM | POA: Diagnosis not present

## 2020-04-08 DIAGNOSIS — H35372 Puckering of macula, left eye: Secondary | ICD-10-CM | POA: Diagnosis not present

## 2020-06-11 ENCOUNTER — Encounter: Payer: Self-pay | Admitting: Internal Medicine

## 2020-06-11 ENCOUNTER — Ambulatory Visit (INDEPENDENT_AMBULATORY_CARE_PROVIDER_SITE_OTHER): Payer: Medicare Other | Admitting: Internal Medicine

## 2020-06-11 ENCOUNTER — Other Ambulatory Visit: Payer: Self-pay

## 2020-06-11 ENCOUNTER — Ambulatory Visit (INDEPENDENT_AMBULATORY_CARE_PROVIDER_SITE_OTHER): Payer: Medicare Other

## 2020-06-11 VITALS — BP 142/80 | HR 70 | Temp 98.1°F | Resp 16 | Ht 66.0 in | Wt 150.5 lb

## 2020-06-11 DIAGNOSIS — M47812 Spondylosis without myelopathy or radiculopathy, cervical region: Secondary | ICD-10-CM

## 2020-06-11 DIAGNOSIS — M50322 Other cervical disc degeneration at C5-C6 level: Secondary | ICD-10-CM | POA: Diagnosis not present

## 2020-06-11 DIAGNOSIS — M542 Cervicalgia: Secondary | ICD-10-CM | POA: Diagnosis not present

## 2020-06-11 DIAGNOSIS — M50323 Other cervical disc degeneration at C6-C7 level: Secondary | ICD-10-CM | POA: Diagnosis not present

## 2020-06-11 MED ORDER — NABUMETONE 500 MG PO TABS: 500.0000 mg | ORAL_TABLET | Freq: Two times a day (BID) | ORAL | 0 refills | Status: DC | PRN

## 2020-06-11 NOTE — Progress Notes (Signed)
Subjective:  Patient ID: Nicholas Frye, male    DOB: Apr 09, 1930  Age: 86 y.o. MRN: [id number]  CC: Neck Pain  This visit occurred during the SARS-CoV-2 public health emergency.  Safety protocols were in place, including screening questions prior to the visit, additional usage of staff PPE, and extensive cleaning of exam room while observing appropriate contact time as indicated for disinfecting solutions.    HPI Nicholas Frye presents for f/up - He complains of chronic neck pain that has somewhat worsened over the last few weeks.  The pain occasionally radiates towards his right shoulder but never into the upper extremities.  He has pain with movement.  He denies any trauma or injury.  He denies paresthesias.  He is not getting much symptom relief with Advil and says the pain keeps him awake at night.  Outpatient Medications Prior to Visit  Medication Sig Dispense Refill  . Multiple Vitamins-Minerals (PRESERVISION AREDS PO) Take by mouth.     . EPINEPHrine (EPIPEN) 0.3 mg/0.3 mL IJ SOAJ injection Inject 0.3 mLs (0.3 mg total) into the muscle once. (Patient not taking: Reported on 08/17/2019) 1 Device 3   No facility-administered medications prior to visit.    ROS Review of Systems  Constitutional: Negative.  Negative for chills, diaphoresis, fatigue and fever.  HENT: Negative.  Negative for trouble swallowing and voice change.   Eyes: Negative.   Respiratory: Negative for cough, chest tightness, shortness of breath and wheezing.   Cardiovascular: Negative for chest pain, palpitations and leg swelling.  Gastrointestinal: Negative for abdominal pain, constipation, diarrhea, nausea and vomiting.  Endocrine: Negative.   Genitourinary: Negative.  Negative for difficulty urinating and dysuria.  Musculoskeletal: Positive for neck pain. Negative for back pain.  Skin: Negative.   Neurological: Negative.  Negative for dizziness, weakness, light-headedness, numbness and headaches.    Hematological: Negative for adenopathy. Does not bruise/bleed easily.  Psychiatric/Behavioral: Negative.     Objective:  BP (!) 142/80 (BP Location: Left Arm, Patient Position: Sitting, Cuff Size: Normal)   Pulse 70   Temp 98.1 F (36.7 C) (Oral)   Resp 16   Ht 5\' 6"  (1.676 m)   Wt 150 lb 8 oz (68.3 kg)   SpO2 96%   BMI 24.29 kg/m   BP Readings from Last 3 Encounters:  06/11/20 (!) 142/80  08/17/19 132/82  10/13/18 (!) 142/82    Wt Readings from Last 3 Encounters:  06/11/20 150 lb 8 oz (68.3 kg)  08/17/19 147 lb 4 oz (66.8 kg)  10/13/18 149 lb 4 oz (67.7 kg)    Physical Exam Vitals reviewed.  Constitutional:      Appearance: Normal appearance.  HENT:     Nose: Nose normal.     Mouth/Throat:     Mouth: Mucous membranes are moist.  Eyes:     General: No scleral icterus.    Conjunctiva/sclera: Conjunctivae normal.  Cardiovascular:     Rate and Rhythm: Normal rate and regular rhythm.     Heart sounds: No murmur heard.   Pulmonary:     Effort: Pulmonary effort is normal.     Breath sounds: No stridor. No wheezing, rhonchi or rales.  Abdominal:     General: Abdomen is flat. Bowel sounds are normal. There is no distension.     Palpations: Abdomen is soft. There is no hepatomegaly, splenomegaly or mass.     Tenderness: There is no abdominal tenderness.  Musculoskeletal:        General: Normal range of  motion.     Cervical back: Normal range of motion and neck supple. No rigidity or tenderness.     Right lower leg: No edema.  Skin:    General: Skin is warm and dry.     Coloration: Skin is not pale.  Neurological:     Mental Status: He is alert.     Cranial Nerves: Cranial nerves are intact.     Sensory: Sensation is intact.     Motor: Motor function is intact.     Coordination: Coordination is intact.     Gait: Gait is intact. Gait normal.     Deep Tendon Reflexes: Reflexes normal.     Reflex Scores:      Tricep reflexes are 0 on the right side and 0 on the  left side.      Bicep reflexes are 0 on the right side and 0 on the left side.      Brachioradialis reflexes are 0 on the right side and 0 on the left side.      Patellar reflexes are 0 on the right side and 0 on the left side.      Achilles reflexes are 0 on the right side and 0 on the left side.    Lab Results  Component Value Date   WBC 7.3 10/13/2018   HGB 16.1 10/13/2018   HCT 47.7 10/13/2018   PLT 167.0 10/13/2018   GLUCOSE 85 08/17/2019   CHOL 190 08/17/2019   TRIG 43.0 08/17/2019   HDL 89.10 08/17/2019   LDLDIRECT 101.3 04/03/2011   LDLCALC 93 08/17/2019   ALT 11 08/17/2019   AST 16 08/17/2019   NA 140 08/17/2019   K 4.2 08/17/2019   CL 104 08/17/2019   CREATININE 1.22 08/17/2019   BUN 19 08/17/2019   CO2 27 08/17/2019   TSH 3.58 08/17/2019   PSA 2.03 04/03/2011   HGBA1C 5.3 08/17/2019    DG Finger Index Right  Result Date: 10/13/2018 CLINICAL DATA:  Fall.  Index finger injury. EXAM: RIGHT INDEX FINGER 2+V COMPARISON:  No prior. FINDINGS: Diffuse soft tissue swelling. Tiny bony density noted posterior to the distal aspect of the proximal phalanx of the right second digit. This could represent tiny fracture chip. No other focal abnormality identified. IMPRESSION: Diffuse soft tissue swelling. Tiny bony density noted along the posterior aspect of the distal phalanx of the right second digit. This could represent a tiny fracture chip. Electronically Signed   By: Marcello Moores  Register   On: 10/13/2018 12:50    DG Cervical Spine Complete  Result Date: 06/12/2020 CLINICAL DATA:  Right lateral neck pain for 3 weeks EXAM: CERVICAL SPINE - COMPLETE 4+ VIEW COMPARISON:  04/20/2017 FINDINGS: Normal alignment with severe mid and lower cervical degenerative disc disease from C3 through C7. The C5-6 and C6-7 levels are most severe with disc space narrowing, sclerosis and endplate osteophytes. Multilevel posterior facet arthropathy present. Normal prevertebral soft tissues. Facets are  aligned. Foramina are patent. Intact odontoid. Trachea midline.  Lung apices are clear. IMPRESSION: Similar advanced lower cervical degenerative change most severe at C5-6 and C6-7. No acute finding by plain radiography. Electronically Signed   By: Jerilynn Mages.  Shick M.D.   On: 06/12/2020 10:49    Assessment & Plan:   Hemi was seen today for neck pain.  Diagnoses and all orders for this visit:  Neck pain on right side- Based on his symptoms, exam, and plain films he has cervical spondylosis without myelopathy.  I recommended that  he treat this with nabumetone. -     DG Cervical Spine Complete; Future  Cervical spondylosis without myelopathy -     nabumetone (RELAFEN) 500 MG tablet; Take 1 tablet (500 mg total) by mouth 2 (two) times daily as needed.   I am having Nicholas Frye "Ben" start on nabumetone. I am also having him maintain his EPINEPHrine and Multiple Vitamins-Minerals (PRESERVISION AREDS PO).  Meds ordered this encounter  Medications  . nabumetone (RELAFEN) 500 MG tablet    Sig: Take 1 tablet (500 mg total) by mouth 2 (two) times daily as needed.    Dispense:  180 tablet    Refill:  0     Follow-up: Return in about 3 months (around 09/11/2020).  [name], MD

## 2020-06-11 NOTE — Patient Instructions (Signed)
Degenerative Disk Disease  Degenerative disk disease is a condition caused by changes that occur in the spinal disks as a person ages. Spinal disks are soft and compressible disks located between the bones of your spine (vertebrae). These disks act like shock absorbers. Degenerative disk disease can affect the whole spine. However, the neck and lower back are most often affected. Many changes can occur in the spinal disks with aging, such as:  The spinal disks may dry and shrink.  Small tears may occur in the tough, outer covering of the disk (annulus).  The disk space may become smaller due to loss of water.  Abnormal growths in the bone (spurs) may occur. This can put pressure on the nerve roots exiting the spinal canal, causing pain.  The spinal canal may become narrowed. What are the causes? This condition may be caused by:  Normal degeneration with age.  Injuries.  Certain activities and sports that cause damage. What increases the risk? The following factors may make you more likely to develop this condition:  Being overweight.  Having a family history of degenerative disk disease.  Smoking.  Sudden injury.  Doing work that requires heavy lifting. What are the signs or symptoms? Symptoms of this condition include:  Pain that varies in intensity. Some people have no pain, while others have severe pain. The location of the pain depends on the part of your backbone that is affected. You may have: ? Pain in your neck or arm if a disk in your neck area is affected. ? Pain in your back, buttocks, or legs if a disk in your lower back is affected.  Pain that becomes worse while bending or reaching up, or with twisting movements.  Pain that may start gradually and then get worse as time passes. It may also start after a major or minor injury.  Numbness or tingling in the arms or legs. How is this diagnosed? This condition may be diagnosed based on:  Your symptoms and  medical history.  A physical exam.  Imaging tests, including: ? An X-ray of the spine. ? MRI. How is this treated? This condition may be treated with:  Medicines.  Rehabilitation exercises. These activities aim to strengthen muscles in your back and abdomen to better support your spine. If treatments do not help to relieve your symptoms or you have severe pain, you may need surgery. Follow these instructions at home: Medicines  Take over-the-counter and prescription medicines only as told by your health care provider.  Do not drive or use heavy machinery while taking prescription pain medicine.  If you are taking prescription pain medicine, take actions to prevent or treat constipation. Your health care provider may recommend that you: ? Drink enough fluid to keep your urine pale yellow. ? Eat foods that are high in fiber, such as fresh fruits and vegetables, whole grains, and beans. ? Limit foods that are high in fat and processed sugars, such as fried or sweet foods. ? Take an over-the-counter or prescription medicine for constipation. Activity  Rest as told by your health care provider.  Ask your health care provider what activities are safe for you. Return to your normal activities as directed.  Avoid sitting for a long time without moving. Get up to take short walks every 1-2 hours. This is important to improve blood flow and breathing. Ask for help if you feel weak or unsteady.  Perform relaxation exercises as told by your health care provider.  Maintain good posture.    Do not lift anything that is heavier than 10 lb (4.5 kg), or the limit that you are told, until your health care provider says that it is safe.  Follow proper lifting and walking techniques as told by your health care provider. Managing pain, stiffness, and swelling   If directed, put ice on the painful area. Icing can help to relieve pain. ? Put ice in a plastic bag. ? Place a towel between your  skin and the bag. ? Leave the ice on for 20 minutes, 2-3 times a day.  If directed, apply heat to the painful area as often as told by your health care provider. Heat can reduce the stiffness of your muscles. Use the heat source that your health care provider recommends, such as a moist heat pack or a heating pad. ? Place a towel between your skin and the heat source. ? Leave the heat on for 20-30 minutes. ? Remove the heat if your skin turns bright red. This is especially important if you are unable to feel pain, heat, or cold. You may have a greater risk of getting burned. General instructions  Change your sitting, standing, and sleeping habits as told by your health care provider.  Avoid sitting in the same position for long periods of time. Change positions frequently.  Lose weight or maintain a healthy weight as told by your health care provider.  Do not use any products that contain nicotine or tobacco, such as cigarettes and e-cigarettes. If you need help quitting, ask your health care provider.  Wear supportive footwear.  Keep all follow-up visits as told by your health care provider. This is important. This may include visits for physical therapy. Contact a health care provider if you:  Have pain that does not go away within 1-4 weeks.  Lose your appetite.  Lose weight without trying. Get help right away if you:  Have severe pain.  Notice weakness in your arms, hands, or legs.  Begin to lose control of your bladder or bowel movements.  Have fevers or night sweats. Summary  Degenerative disk disease is a condition caused by changes that occur in the spinal disks as a person ages.  Degenerative disk disease can affect the whole spine. However, the neck and lower back are most often affected.  Take over-the-counter and prescription medicines only as told by your health care provider. This information is not intended to replace advice given to you by your health care  provider. Make sure you discuss any questions you have with your health care provider. Document Revised: 11/11/2017 Document Reviewed: 11/11/2017 Elsevier Patient Education  2020 [employer]  

## 2020-06-26 ENCOUNTER — Telehealth: Payer: Self-pay

## 2020-06-27 NOTE — Telephone Encounter (Signed)
Record updated

## 2020-09-11 ENCOUNTER — Other Ambulatory Visit: Payer: Self-pay

## 2020-09-11 ENCOUNTER — Ambulatory Visit (INDEPENDENT_AMBULATORY_CARE_PROVIDER_SITE_OTHER): Payer: Medicare Other | Admitting: Internal Medicine

## 2020-09-11 ENCOUNTER — Encounter: Payer: Self-pay | Admitting: Internal Medicine

## 2020-09-11 VITALS — BP 118/76 | HR 74 | Temp 98.4°F | Resp 16 | Ht 66.0 in | Wt 148.0 lb

## 2020-09-11 DIAGNOSIS — H6123 Impacted cerumen, bilateral: Secondary | ICD-10-CM

## 2020-09-11 DIAGNOSIS — D696 Thrombocytopenia, unspecified: Secondary | ICD-10-CM

## 2020-09-11 DIAGNOSIS — K219 Gastro-esophageal reflux disease without esophagitis: Secondary | ICD-10-CM

## 2020-09-11 DIAGNOSIS — Z0000 Encounter for general adult medical examination without abnormal findings: Secondary | ICD-10-CM | POA: Diagnosis not present

## 2020-09-11 DIAGNOSIS — E785 Hyperlipidemia, unspecified: Secondary | ICD-10-CM

## 2020-09-11 DIAGNOSIS — Z23 Encounter for immunization: Secondary | ICD-10-CM

## 2020-09-11 LAB — LIPID PANEL
Cholesterol: 197 mg/dL (ref 0–200)
HDL: 93.7 mg/dL (ref >39.00)
LDL Cholesterol: 95 mg/dL (ref 0–99)
NonHDL: 103.32
Total CHOL/HDL Ratio: 2
Triglycerides: 43 mg/dL (ref 0.0–149.0)
VLDL: 8.6 mg/dL (ref 0.0–40.0)

## 2020-09-11 LAB — BASIC METABOLIC PANEL
BUN: 18 mg/dL (ref 6–23)
CO2: 27 mEq/L (ref 19–32)
Calcium: 9.3 mg/dL (ref 8.4–10.5)
Chloride: 105 mEq/L (ref 96–112)
Creatinine, Ser: 1.24 mg/dL (ref 0.40–1.50)
GFR: 51.61 mL/min — ABNORMAL LOW (ref >60.00)
Glucose, Bld: 85 mg/dL (ref 70–99)
Potassium: 3.8 mEq/L (ref 3.5–5.1)
Sodium: 140 mEq/L (ref 135–145)

## 2020-09-11 LAB — CBC WITH DIFFERENTIAL/PLATELET
Basophils Absolute: 0.1 10*3/uL (ref 0.0–0.1)
Basophils Relative: 0.7 % (ref 0.0–3.0)
Eosinophils Absolute: 0.3 10*3/uL (ref 0.0–0.7)
Eosinophils Relative: 3.6 % (ref 0.0–5.0)
HCT: 44.3 % (ref 39.0–52.0)
Hemoglobin: 15.1 g/dL (ref 13.0–17.0)
Lymphocytes Relative: 21.4 % (ref 12.0–46.0)
Lymphs Abs: 1.8 10*3/uL (ref 0.7–4.0)
MCHC: 34.2 g/dL (ref 30.0–36.0)
MCV: 93.7 fl (ref 78.0–100.0)
Monocytes Absolute: 0.8 10*3/uL (ref 0.1–1.0)
Monocytes Relative: 9.1 % (ref 3.0–12.0)
Neutro Abs: 5.5 10*3/uL (ref 1.4–7.7)
Neutrophils Relative %: 65.2 % (ref 43.0–77.0)
Platelets: 144 10*3/uL — ABNORMAL LOW (ref 150.0–400.0)
RBC: 4.73 Mil/uL (ref 4.22–5.81)
RDW: 14.2 % (ref 11.5–15.5)
WBC: 8.4 10*3/uL (ref 4.0–10.5)

## 2020-09-11 LAB — TSH: TSH: 5.1 u[IU]/mL — ABNORMAL HIGH (ref 0.35–4.50)

## 2020-09-11 LAB — HEPATIC FUNCTION PANEL
ALT: 13 U/L (ref 0–53)
AST: 16 U/L (ref 0–37)
Albumin: 4.2 g/dL (ref 3.5–5.2)
Alkaline Phosphatase: 71 U/L (ref 39–117)
Bilirubin, Direct: 0.1 mg/dL (ref 0.0–0.3)
Total Bilirubin: 0.9 mg/dL (ref 0.2–1.2)
Total Protein: 7.1 g/dL (ref 6.0–8.3)

## 2020-09-11 NOTE — Progress Notes (Signed)
Subjective:  Patient ID: Nicholas Frye, male    DOB: [birth year]  Age: 86 y.o. MRN: [id number]  CC: Annual Exam  This visit occurred during the SARS-CoV-2 public health emergency.  Safety protocols were in place, including screening questions prior to the visit, additional usage of staff PPE, and extensive cleaning of exam room while observing appropriate contact time as indicated for disinfecting solutions.    HPI- He is seen today for a CPX.  He complains of hearing loss in both ears for several weeks   Outpatient Medications Prior to Visit  Medication Sig Dispense Refill  . EPINEPHrine (EPIPEN) 0.3 mg/0.3 mL IJ SOAJ injection Inject 0.3 mLs (0.3 mg total) into the muscle once. 1 Device 3  . Multiple Vitamins-Minerals (PRESERVISION AREDS PO) Take by mouth.     . nabumetone (RELAFEN) 500 MG tablet Take 1 tablet (500 mg total) by mouth 2 (two) times daily as needed. 180 tablet 0   No facility-administered medications prior to visit.    ROS Review of Systems  Constitutional: Negative.  Negative for appetite change, diaphoresis, fatigue and unexpected weight change.  HENT: Positive for hearing loss. Negative for ear pain, rhinorrhea, sinus pressure, sore throat and tinnitus.   Eyes: Negative for visual disturbance.  Respiratory: Negative for cough, chest tightness, shortness of breath and wheezing.   Cardiovascular: Negative for chest pain, palpitations and leg swelling.  Gastrointestinal: Negative for abdominal pain, constipation, diarrhea, nausea and vomiting.  Endocrine: Negative.   Genitourinary: Negative.  Negative for difficulty urinating and dysuria.  Musculoskeletal: Negative.  Negative for arthralgias and myalgias.  Skin: Negative.   Neurological: Negative.  Negative for dizziness and weakness.  Hematological: Negative for adenopathy. Does not bruise/bleed easily.  Psychiatric/Behavioral: Negative.     Objective:  BP 118/76   Pulse 74   Temp 98.4 F (36.9 C)  (Oral)   Resp 16   Ht 5\' 6"  (1.676 m)   Wt 148 lb (67.1 kg)   SpO2 94%   BMI 23.89 kg/m   BP Readings from Last 3 Encounters:  09/11/20 118/76  06/11/20 (!) 142/80  08/17/19 132/82    Wt Readings from Last 3 Encounters:  09/11/20 148 lb (67.1 kg)  06/11/20 150 lb 8 oz (68.3 kg)  08/17/19 147 lb 4 oz (66.8 kg)    Physical Exam Vitals reviewed.  Constitutional:      Appearance: Normal appearance.  HENT:     Right Ear: Tympanic membrane and external ear normal. Decreased hearing noted. There is impacted cerumen. Tympanic membrane is not injected.     Left Ear: Tympanic membrane and external ear normal. Decreased hearing noted. There is impacted cerumen. Tympanic membrane is not injected.     Ears:     Comments: I put Colace in both EACs and then irrigated them using water and an ear pick.  The cerumen was removed.  His hearing is returned to normal.  He has no symptoms after the procedure and tolerated it well.    Nose: No congestion.     Mouth/Throat:     Mouth: Mucous membranes are moist.  Eyes:     General: No scleral icterus.    Conjunctiva/sclera: Conjunctivae normal.  Cardiovascular:     Rate and Rhythm: Normal rate and regular rhythm.     Heart sounds: No murmur heard.   Pulmonary:     Effort: Pulmonary effort is normal.     Breath sounds: No stridor. No wheezing, rhonchi or rales.  Abdominal:  General: Bowel sounds are normal. There is no distension.     Palpations: Abdomen is soft. There is no hepatomegaly, splenomegaly or mass.     Tenderness: There is no abdominal tenderness.     Hernia: No hernia is present.  Musculoskeletal:        General: Normal range of motion.     Cervical back: Neck supple.     Right lower leg: No edema.     Left lower leg: No edema.  Lymphadenopathy:     Cervical: No cervical adenopathy.  Skin:    General: Skin is warm and dry.     Coloration: Skin is not pale.  Neurological:     General: No focal deficit present.      Mental Status: He is alert.  Psychiatric:        Mood and Affect: Mood normal.        Behavior: Behavior normal.     Lab Results  Component Value Date   WBC 8.4 09/11/2020   HGB 15.1 09/11/2020   HCT 44.3 09/11/2020   PLT 144.0 (L) 09/11/2020   GLUCOSE 85 09/11/2020   CHOL 197 09/11/2020   TRIG 43.0 09/11/2020   HDL 93.70 09/11/2020   LDLDIRECT 101.3 04/03/2011   LDLCALC 95 09/11/2020   ALT 13 09/11/2020   AST 16 09/11/2020   NA 140 09/11/2020   K 3.8 09/11/2020   CL 105 09/11/2020   CREATININE 1.24 09/11/2020   BUN 18 09/11/2020   CO2 27 09/11/2020   TSH 5.10 (H) 09/11/2020   PSA 2.03 04/03/2011   HGBA1C 5.3 08/17/2019    DG Finger Index Right  Result Date: 10/13/2018 CLINICAL DATA:  Fall.  Index finger injury. EXAM: RIGHT INDEX FINGER 2+V COMPARISON:  No prior. FINDINGS: Diffuse soft tissue swelling. Tiny bony density noted posterior to the distal aspect of the proximal phalanx of the right second digit. This could represent tiny fracture chip. No other focal abnormality identified. IMPRESSION: Diffuse soft tissue swelling. Tiny bony density noted along the posterior aspect of the distal phalanx of the right second digit. This could represent a tiny fracture chip. Electronically Signed   By: Marcello Moores  Register   On: 10/13/2018 12:50    Assessment & Plan:   Yobany was seen today for annual exam.  Diagnoses and all orders for this visit:  Flu vaccine need -     Flu Vaccine QUAD High Dose(Fluad)  Hearing loss due to cerumen impaction, bilateral- His hearing is normal now that the cerumen has been removed.  Gastroesophageal reflux disease without esophagitis- He has no symptoms or complications related to this. -     CBC with Differential/Platelet; Future -     BASIC METABOLIC PANEL WITH GFR; Future -     Basic metabolic panel; Future -     Basic metabolic panel -     CBC with Differential/Platelet  Hyperlipidemia with target LDL less than 100- Statin therapy is  not indicated. -     BASIC METABOLIC PANEL WITH GFR; Future -     Lipid panel; Future -     TSH; Future -     Hepatic function panel; Future -     Basic metabolic panel; Future -     Basic metabolic panel -     Hepatic function panel -     TSH -     Lipid panel  Routine health maintenance- Exam completed, labs reviewed, vaccines reviewed, no cancer screenings are indicated, patient education was  given.  Thrombocytopenia ([place])- He has no complaints of bleeding or bruising.  This is likely ITP.  He will come back to be screened for vitamin B12 deficiency.   I am having [name] maintain his EPINEPHrine, Multiple Vitamins-Minerals (PRESERVISION AREDS PO), and nabumetone.  No orders of the defined types were placed in this encounter.    Follow-up: Return in about 6 months (around 03/12/2021).  [name], MD

## 2020-09-11 NOTE — Patient Instructions (Signed)

## 2020-09-11 NOTE — Progress Notes (Signed)
Patient consent obtained. Irrigation with water and peroxide performed. Full view of tympanic membranes after procedure.  Patient tolerated procedure well.   

## 2020-09-12 DIAGNOSIS — D696 Thrombocytopenia, unspecified: Secondary | ICD-10-CM | POA: Insufficient documentation

## 2020-09-13 ENCOUNTER — Encounter: Payer: Self-pay | Admitting: Internal Medicine

## 2020-09-16 ENCOUNTER — Encounter: Payer: Medicare Other | Admitting: Internal Medicine

## 2020-09-23 ENCOUNTER — Encounter: Payer: Self-pay | Admitting: Internal Medicine

## 2020-09-23 ENCOUNTER — Ambulatory Visit (INDEPENDENT_AMBULATORY_CARE_PROVIDER_SITE_OTHER): Payer: Medicare Other | Admitting: Internal Medicine

## 2020-09-23 ENCOUNTER — Other Ambulatory Visit: Payer: Self-pay | Admitting: Internal Medicine

## 2020-09-23 ENCOUNTER — Other Ambulatory Visit: Payer: Self-pay

## 2020-09-23 VITALS — BP 136/82 | HR 68 | Ht 66.0 in

## 2020-09-23 DIAGNOSIS — C44622 Squamous cell carcinoma of skin of right upper limb, including shoulder: Secondary | ICD-10-CM

## 2020-09-23 DIAGNOSIS — D696 Thrombocytopenia, unspecified: Secondary | ICD-10-CM

## 2020-09-23 DIAGNOSIS — L989 Disorder of the skin and subcutaneous tissue, unspecified: Secondary | ICD-10-CM

## 2020-09-23 LAB — CBC WITH DIFFERENTIAL/PLATELET
Basophils Absolute: 0 10*3/uL (ref 0.0–0.1)
Basophils Relative: 0.6 % (ref 0.0–3.0)
Eosinophils Absolute: 0.1 10*3/uL (ref 0.0–0.7)
Eosinophils Relative: 1.4 % (ref 0.0–5.0)
HCT: 45.2 % (ref 39.0–52.0)
Hemoglobin: 15.4 g/dL (ref 13.0–17.0)
Lymphocytes Relative: 22.7 % (ref 12.0–46.0)
Lymphs Abs: 1.8 10*3/uL (ref 0.7–4.0)
MCHC: 34 g/dL (ref 30.0–36.0)
MCV: 93.3 fl (ref 78.0–100.0)
Monocytes Absolute: 0.7 10*3/uL (ref 0.1–1.0)
Monocytes Relative: 9.4 % (ref 3.0–12.0)
Neutro Abs: 5.1 10*3/uL (ref 1.4–7.7)
Neutrophils Relative %: 65.9 % (ref 43.0–77.0)
Platelets: 160 10*3/uL (ref 150.0–400.0)
RBC: 4.85 Mil/uL (ref 4.22–5.81)
RDW: 14.4 % (ref 11.5–15.5)
WBC: 7.8 10*3/uL (ref 4.0–10.5)

## 2020-09-23 LAB — VITAMIN B12: Vitamin B-12: 662 pg/mL (ref 211–911)

## 2020-09-23 LAB — FOLATE: Folate: 10.9 ng/mL (ref >5.9)

## 2020-09-23 NOTE — Progress Notes (Signed)
Subjective:  Patient ID: Nicholas Frye, male    DOB: Mar 26, 1930  Age: 87 y.o. MRN: [id number]  CC: Follow-up  This visit occurred during the SARS-CoV-2 public health emergency.  Safety protocols were in place, including screening questions prior to the visit, additional usage of staff PPE, and extensive cleaning of exam room while observing appropriate contact time as indicated for disinfecting solutions.    HPI Nicholas Frye presents for f/up - He comes in today to have his platelet count rechecked.  It was recently low.  He denies any history of bleeding or bruising.  He also has a lesion on his right posterior upper arm near the elbow that he wants to have biopsied.  It does not bother him but he says it is growing.  Outpatient Medications Prior to Visit  Medication Sig Dispense Refill   EPINEPHrine (EPIPEN) 0.3 mg/0.3 mL IJ SOAJ injection Inject 0.3 mLs (0.3 mg total) into the muscle once. 1 Device 3   Multiple Vitamins-Minerals (PRESERVISION AREDS PO) Take by mouth.      nabumetone (RELAFEN) 500 MG tablet Take 1 tablet (500 mg total) by mouth 2 (two) times daily as needed. 180 tablet 0   No facility-administered medications prior to visit.    ROS Review of Systems  Constitutional: Negative for diaphoresis and fatigue.  HENT: Negative for nosebleeds.   Eyes: Negative.   Respiratory: Negative for cough, chest tightness, shortness of breath and wheezing.   Cardiovascular: Negative for chest pain, palpitations and leg swelling.  Gastrointestinal: Negative for abdominal pain, anal bleeding, blood in stool, constipation, diarrhea, nausea and vomiting.  Endocrine: Negative.   Genitourinary: Negative for difficulty urinating and hematuria.  Musculoskeletal: Negative.   Skin: Positive for color change. Negative for rash.  Allergic/Immunologic: Negative.   Neurological: Negative.  Negative for dizziness.  Hematological: Negative for adenopathy. Does not bruise/bleed  easily.  Psychiatric/Behavioral: Negative.  Negative for agitation.    Objective:  BP 136/82    Pulse 68    Ht 5\' 6"  (1.676 m)    SpO2 96%    BMI 23.89 kg/m   BP Readings from Last 3 Encounters:  09/23/20 136/82  09/11/20 118/76  06/11/20 (!) 142/80    Wt Readings from Last 3 Encounters:  09/11/20 148 lb (67.1 kg)  06/11/20 150 lb 8 oz (68.3 kg)  08/17/19 147 lb 4 oz (66.8 kg)    Physical Exam Vitals reviewed.  Constitutional:      Appearance: Normal appearance.  HENT:     Nose: Nose normal.     Mouth/Throat:     Mouth: Mucous membranes are moist.  Eyes:     General: No scleral icterus.    Conjunctiva/sclera: Conjunctivae normal.  Cardiovascular:     Rate and Rhythm: Normal rate and regular rhythm.     Heart sounds: No murmur heard.   Pulmonary:     Effort: Pulmonary effort is normal.     Breath sounds: No stridor. No wheezing, rhonchi or rales.  Abdominal:     General: Abdomen is flat.     Palpations: There is no mass.     Tenderness: There is no abdominal tenderness. There is no guarding.  Musculoskeletal:        General: Normal range of motion.       Arms:     Cervical back: Neck supple.     Right lower leg: No edema.     Left lower leg: No edema.  Lymphadenopathy:  Cervical: No cervical adenopathy.  Skin:    General: Skin is warm and dry.     Findings: Lesion present.  Neurological:     General: No focal deficit present.     Mental Status: He is alert.     Lab Results  Component Value Date   WBC 7.8 09/23/2020   HGB 15.4 09/23/2020   HCT 45.2 09/23/2020   PLT 160.0 09/23/2020   GLUCOSE 85 09/11/2020   CHOL 197 09/11/2020   TRIG 43.0 09/11/2020   HDL 93.70 09/11/2020   LDLDIRECT 101.3 04/03/2011   LDLCALC 95 09/11/2020   ALT 13 09/11/2020   AST 16 09/11/2020   NA 140 09/11/2020   K 3.8 09/11/2020   CL 105 09/11/2020   CREATININE 1.24 09/11/2020   BUN 18 09/11/2020   CO2 27 09/11/2020   TSH 5.10 (H) 09/11/2020   PSA 2.03 04/03/2011    HGBA1C 5.3 08/17/2019    DG Finger Index Right  Result Date: 10/13/2018 CLINICAL DATA:  Fall.  Index finger injury. EXAM: RIGHT INDEX FINGER 2+V COMPARISON:  No prior. FINDINGS: Diffuse soft tissue swelling. Tiny bony density noted posterior to the distal aspect of the proximal phalanx of the right second digit. This could represent tiny fracture chip. No other focal abnormality identified. IMPRESSION: Diffuse soft tissue swelling. Tiny bony density noted along the posterior aspect of the distal phalanx of the right second digit. This could represent a tiny fracture chip. Electronically Signed   By: Marcello Moores  Register   On: 10/13/2018 12:50    After informed verbal consent was obtained. Using Betadine for cleansing and 1% Lidocaine with epinephrine for anesthetic (2 cc's used). With sterile technique a 4 mm punch biopsy was used to obtain a biopsy specimen of the lesion. Hemostasis was obtained by pressure and wound was  Sutured - 1 suture with 4.0 nylon. The closure effect is good. Antibiotic and a dressing are applied. The specimen is labeled and sent to pathology for evaluation. The procedure was well tolerated without complications.  Assessment & Plan:   Nicholas Frye was seen today for follow-up.  Diagnoses and all orders for this visit:  Thrombocytopenia ([place])- His platelet count is normal now.  Testing for B12 and folate deficiency is negative. -     CBC with Differential/Platelet; Future -     Folate; Future -     Vitamin B12; Future -     Vitamin B12 -     Folate -     CBC with Differential/Platelet  Skin lesion of right arm- I await the results of pathology.  He will return in 7 to 10 days to have the suture removed. -     Dermatology pathology; Future -     Dermatology pathology   I am having Nicholas Lav "Ben" maintain his EPINEPHrine, Multiple Vitamins-Minerals (PRESERVISION AREDS PO), and nabumetone.  No orders of the defined types were placed in this  encounter.    Follow-up: Return in about 1 week (around 09/30/2020).  Nicholas Calico, MD

## 2020-09-23 NOTE — Patient Instructions (Signed)
Skin Biopsy, Care After This sheet gives you information about how to care for yourself after your procedure. Your health care provider may also give you more specific instructions. If you have problems or questions, contact your health care provider. What can I expect after the procedure? After the procedure, it is common to have:  Soreness.  Bruising.  Itching. Follow these instructions at home: Biopsy site care Follow instructions from your health care provider about how to take care of your biopsy site. Make sure you:  Wash your hands with soap and water before and after you change your bandage (dressing). If soap and water are not available, use hand sanitizer.  Apply ointment on your biopsy site as directed by your health care provider.  Change your dressing as told by your health care provider.  Leave stitches (sutures), skin glue, or adhesive strips in place. These skin closures may need to stay in place for 2 weeks or longer. If adhesive strip edges start to loosen and curl up, you may trim the loose edges. Do not remove adhesive strips completely unless your health care provider tells you to do that.  If the biopsy area bleeds, apply gentle pressure for 10 minutes. Check your biopsy site every day for signs of infection. Check for:  Redness, swelling, or pain.  Fluid or blood.  Warmth.  Pus or a bad smell.  General instructions  Rest and then return to your normal activities as told by your health care provider.  Take over-the-counter and prescription medicines only as told by your health care provider.  Keep all follow-up visits as told by your health care provider. This is important. Contact a health care provider if:  You have redness, swelling, or pain around your biopsy site.  You have fluid or blood coming from your biopsy site.  Your biopsy site feels warm to the touch.  You have pus or a bad smell coming from your biopsy site.  You have a  fever.  Your sutures, skin glue, or adhesive strips loosen or come off sooner than expected. Get help right away if:  You have bleeding that does not stop with pressure or a dressing. Summary  After the procedure, it is common to have soreness, bruising, and itching at the site.  Follow instructions from your health care provider about how to take care of your biopsy site.  Check your biopsy site every day for signs of infection.  Contact a health care provider if you have redness, swelling, or pain around your biopsy site, or your biopsy site feels warm to the touch.  Keep all follow-up visits as told by your health care provider. This is important. This information is not intended to replace advice given to you by your health care provider. Make sure you discuss any questions you have with your health care provider. Document Revised: 05/16/2018 Document Reviewed: 05/16/2018 Elsevier Patient Education  2020 [employer]  

## 2020-09-30 ENCOUNTER — Encounter: Payer: Self-pay | Admitting: Internal Medicine

## 2020-09-30 ENCOUNTER — Other Ambulatory Visit: Payer: Self-pay

## 2020-09-30 ENCOUNTER — Ambulatory Visit (INDEPENDENT_AMBULATORY_CARE_PROVIDER_SITE_OTHER): Payer: Medicare Other | Admitting: Internal Medicine

## 2020-09-30 VITALS — BP 116/78 | HR 66 | Temp 98.2°F | Resp 16 | Ht 66.0 in | Wt 148.0 lb

## 2020-09-30 DIAGNOSIS — C44622 Squamous cell carcinoma of skin of right upper limb, including shoulder: Secondary | ICD-10-CM

## 2020-09-30 NOTE — Progress Notes (Signed)
Subjective:  Patient ID: Nicholas Frye, male    DOB: 07-Oct-1930  Age: 86 y.o. MRN: [id number]  CC: Wound Check  This visit occurred during the SARS-CoV-2 public health emergency.  Safety protocols were in place, including screening questions prior to the visit, additional usage of staff PPE, and extensive cleaning of exam room while observing appropriate contact time as indicated for disinfecting solutions.    HPI Nicholas Frye presents for a wound check - RUE - He underwent punch biopsy of a lesion about 7 days ago.  The pathology is consistent with squamous cell carcinoma.  The margins were free.  The biopsy site has healed nicely.  He has no complaints.  Outpatient Medications Prior to Visit  Medication Sig Dispense Refill  . EPINEPHrine (EPIPEN) 0.3 mg/0.3 mL IJ SOAJ injection Inject 0.3 mLs (0.3 mg total) into the muscle once. 1 Device 3  . Multiple Vitamins-Minerals (PRESERVISION AREDS PO) Take by mouth.     . nabumetone (RELAFEN) 500 MG tablet Take 1 tablet (500 mg total) by mouth 2 (two) times daily as needed. 180 tablet 0   No facility-administered medications prior to visit.    ROS Review of Systems  Constitutional: Negative for chills.  HENT: Negative.   Respiratory: Negative for cough.   Cardiovascular: Negative for leg swelling.  Gastrointestinal: Negative for abdominal pain.  Genitourinary: Negative.   Musculoskeletal: Negative.   Skin: Positive for wound. Negative for color change.  Hematological: Negative for adenopathy. Does not bruise/bleed easily.  All other systems reviewed and are negative.   Objective:  BP 116/78   Pulse 66   Temp 98.2 F (36.8 C) (Oral)   Resp 16   Ht 5\' 6"  (1.676 m)   Wt 148 lb (67.1 kg)   SpO2 96%   BMI 23.89 kg/m   BP Readings from Last 3 Encounters:  09/30/20 116/78  09/23/20 136/82  09/11/20 118/76    Wt Readings from Last 3 Encounters:  09/30/20 148 lb (67.1 kg)  09/11/20 148 lb (67.1 kg)  06/11/20 150  lb 8 oz (68.3 kg)    Physical Exam Musculoskeletal:       Arms:     Lab Results  Component Value Date   WBC 7.8 09/23/2020   HGB 15.4 09/23/2020   HCT 45.2 09/23/2020   PLT 160.0 09/23/2020   GLUCOSE 85 09/11/2020   CHOL 197 09/11/2020   TRIG 43.0 09/11/2020   HDL 93.70 09/11/2020   LDLDIRECT 101.3 04/03/2011   LDLCALC 95 09/11/2020   ALT 13 09/11/2020   AST 16 09/11/2020   NA 140 09/11/2020   K 3.8 09/11/2020   CL 105 09/11/2020   CREATININE 1.24 09/11/2020   BUN 18 09/11/2020   CO2 27 09/11/2020   TSH 5.10 (H) 09/11/2020   PSA 2.03 04/03/2011   HGBA1C 5.3 08/17/2019    DG Finger Index Right  Result Date: 10/13/2018 CLINICAL DATA:  Fall.  Index finger injury. EXAM: RIGHT INDEX FINGER 2+V COMPARISON:  No prior. FINDINGS: Diffuse soft tissue swelling. Tiny bony density noted posterior to the distal aspect of the proximal phalanx of the right second digit. This could represent tiny fracture chip. No other focal abnormality identified. IMPRESSION: Diffuse soft tissue swelling. Tiny bony density noted along the posterior aspect of the distal phalanx of the right second digit. This could represent a tiny fracture chip. Electronically Signed   By: Marcello Moores  Register   On: 10/13/2018 12:50    Skin (M), right upper arm,  margin excision SQUAMOUS CELL CARCINOMA, KERATOACANTHOMA TYPE, FEATURES OF REGRESSION, MARGINS FREE Microscopic Description There is a downward projection of hyperplastic squamous epithelium to form a crateriform lesion containing keratin debris. There is slight squamous atypia. The surrounding dermis is fibrotic and contains chronic inflammatory cells. The changes are consistent with squamous cell carcinoma, keratocanthoma type with features of partial involution. Nicholas Frye. Nicholas Coupe MD Dermatopathologist, Electronic Signature (Case signed 09/25/2020)   Assessment & Plan:   Nicholas Frye was seen today for wound check.  Diagnoses and all orders for this  visit:  SCC (squamous cell carcinoma), arm, right- The biopsy site has healed nicely.  The margins were free of carcinoma.  If there is evidence of recurrence then will refer to dermatology.   I am having Nicholas Lav "Ben" maintain his EPINEPHrine, Multiple Vitamins-Minerals (PRESERVISION AREDS PO), and nabumetone.  No orders of the defined types were placed in this encounter.    Follow-up: No follow-ups on file.  Nicholas Calico, MD

## 2020-10-01 ENCOUNTER — Encounter: Payer: Self-pay | Admitting: Internal Medicine

## 2020-10-01 DIAGNOSIS — C44622 Squamous cell carcinoma of skin of right upper limb, including shoulder: Secondary | ICD-10-CM | POA: Insufficient documentation

## 2020-11-07 DIAGNOSIS — H353132 Nonexudative age-related macular degeneration, bilateral, intermediate dry stage: Secondary | ICD-10-CM | POA: Diagnosis not present

## 2020-11-07 DIAGNOSIS — Z961 Presence of intraocular lens: Secondary | ICD-10-CM | POA: Diagnosis not present

## 2020-11-07 DIAGNOSIS — H35372 Puckering of macula, left eye: Secondary | ICD-10-CM | POA: Diagnosis not present

## 2020-11-12 ENCOUNTER — Telehealth: Payer: Self-pay | Admitting: Internal Medicine

## 2020-11-12 NOTE — Progress Notes (Signed)
  Chronic Care Management   Note  11/12/2020 Name: Nicholas Frye MRN: [id number] DOB: 12-Apr-1930  Nicholas Frye is a 86 y.o. year old male who is a primary care patient of Janith Lima, MD. I reached out to Marlyne Beards by phone today in response to a referral sent by Nicholas Frye's PCP, Janith Lima, MD.   Nicholas Frye was given information about Chronic Care Management services today including:  1. CCM service includes personalized support from designated clinical staff supervised by his physician, including individualized plan of care and coordination with other care providers 2. 24/7 contact phone numbers for assistance for urgent and routine care needs. 3. Service will only be billed when office clinical staff spend 20 minutes or more in a month to coordinate care. 4. Only one practitioner may furnish and bill the service in a calendar month. 5. The patient may stop CCM services at any time (effective at the end of the month) by phone call to the office staff.   Patient agreed to services and verbal consent obtained.   Follow up plan:   Carley Perdue [employer] Scheduler

## 2020-12-26 ENCOUNTER — Telehealth: Payer: Medicare Other

## 2020-12-26 NOTE — Chronic Care Management (AMB) (Deleted)
Chronic Care Management Pharmacy Note  12/26/2020 Name:  Nicholas Frye MRN:  [id number] DOB:  September 05, 1930  Subjective: Nicholas Frye is an 87 y.o. year old male who is a primary patient of Janith Lima, MD.  The CCM team was consulted for assistance with disease management and care coordination needs.    {CCMTELEPHONEFACETOFACE:21091510} for {CCMINITIALFOLLOWUPCHOICE:21091511} in response to provider referral for pharmacy case management and/or care coordination services.   Consent to Services:  {CCMCONSENTOPTIONS:25074}  Patient Care Team: Janith Lima, MD as PCP - General (Internal Medicine) Keene Breath., MD (Ophthalmology) Meredith Mody, MD as Consulting Physician (Family Medicine) Charlton Haws, [facility] as Pharmacist (Pharmacist)  Recent office visits: ***  Recent consult visits: ***  Objective:  Lab Results  Component Value Date   CREATININE 1.24 09/11/2020   BUN 18 09/11/2020   GFR 51.61 (L) 09/11/2020   GFRNONAA 49 (L) 04/01/2015   GFRAA 57 (L) 04/01/2015   NA 140 09/11/2020   K 3.8 09/11/2020   CALCIUM 9.3 09/11/2020   CO2 27 09/11/2020    Lab Results  Component Value Date/Time   HGBA1C 5.3 08/17/2019 09:39 AM   GFR 51.61 (L) 09/11/2020 02:50 PM   GFR 56.21 (L) 08/17/2019 09:39 AM    Last diabetic Eye exam: No results found for: HMDIABEYEEXA  Last diabetic Foot exam: No results found for: HMDIABFOOTEX   Lab Results  Component Value Date   CHOL 197 09/11/2020   HDL 93.70 09/11/2020   LDLCALC 95 09/11/2020   LDLDIRECT 101.3 04/03/2011   TRIG 43.0 09/11/2020   CHOLHDL 2 09/11/2020    Hepatic Function Latest Ref Rng & Units 09/11/2020 08/17/2019 10/13/2018  Total Protein 6.0 - 8.3 g/dL 7.1 7.3 7.0  Albumin 3.5 - 5.2 g/dL 4.2 4.2 4.1  AST 0 - 37 U/L _0 ALT 0 - 53 U/L _1 Alk Phosphatase 39 - 117 U/L 71 80 71  Total Bilirubin 0.2 - 1.2 mg/dL 0.9 1.0 0.8  Bilirubin, Direct 0.0 - 0.3 mg/dL 0.1 0.1 -    Lab  Results  Component Value Date/Time   TSH 5.10 (H) 09/11/2020 02:50 PM   TSH 3.58 08/17/2019 09:39 AM    CBC Latest Ref Rng & Units 09/23/2020 09/11/2020 10/13/2018  WBC 4.0 - 10.5 K/uL 7.8 8.4 7.3  Hemoglobin 13.0 - 17.0 g/dL 15.4 15.1 16.1  Hematocrit 39.0 - 52.0 % 45.2 44.3 47.7  Platelets 150.0 - 400.0 K/uL 160.0 144.0(L) 167.0    No results found for: VD25OH  Clinical ASCVD: {YES/NO:21197} The ASCVD Risk score Mikey Bussing DC Jr., et al., 2013) failed to calculate for the following reasons:   The 2013 ASCVD risk score is only valid for ages 34 to 74    ***Other: (CHADS2VASc if Afib, PHQ9 if depression, MMRC or CAT for COPD, ACT, DEXA)  BP Readings from Last 3 Encounters:  09/30/20 116/78  09/23/20 136/82  09/11/20 118/76   Pulse Readings from Last 3 Encounters:  09/30/20 66  09/23/20 68  09/11/20 74   Wt Readings from Last 3 Encounters:  09/30/20 148 lb (67.1 kg)  09/11/20 148 lb (67.1 kg)  06/11/20 150 lb 8 oz (68.3 kg)    Assessment/Interventions: Review of patient past medical history, allergies, medications, health status, including review of consultants reports, laboratory and other test data, was performed as part of comprehensive evaluation and provision of chronic care management services.   SDOH:  (Social Determinants of Health) assessments and interventions  performed:    CCM Care Plan  Allergies  Allergen Reactions  . Yellow Jacket Venom     Medications Reviewed Today    Reviewed by Janith Lima, MD (Physician) on 10/01/20 at 0800  Med List Status: <None>  Medication Order Taking? Sig Documenting Provider Last Dose Status Informant  EPINEPHrine (EPIPEN) 0.3 mg/0.3 mL IJ SOAJ injection [id number] Yes Inject 0.3 mLs (0.3 mg total) into the muscle once. Janith Lima, MD Taking Active   Multiple Vitamins-Minerals (PRESERVISION AREDS PO) [id number] Yes Take by mouth.  [provider] Taking Active   nabumetone (RELAFEN) 500 MG tablet [id number] Yes  Take 1 tablet (500 mg total) by mouth 2 (two) times daily as needed. Janith Lima, MD Taking Active           Patient Active Problem List   Diagnosis Date Noted  . SCC (squamous cell carcinoma), arm, right 10/01/2020  . Hearing loss due to cerumen impaction, bilateral 09/11/2020  . Cervical spondylosis without myelopathy 06/11/2020  . Irritable bowel syndrome with constipation 08/11/2017  . DDD (degenerative disc disease), lumbar 07/27/2016  . GERD (gastroesophageal reflux disease) 03/01/2014  . Hyperlipidemia with target LDL less than 100 03/01/2014  . Routine health maintenance 02/15/2012  . PVD 04/30/2008    Immunization History  Administered Date(s) Administered  . Fluad Quad(high Dose 65+) 08/17/2019, 09/11/2020  . Influenza, High Dose Seasonal PF 10/13/2018  . PFIZER(Purple Top)SARS-COV-2 Vaccination 12/24/2019, 01/12/2020  . Pneumococcal Conjugate-13 10/08/2014  . Pneumococcal Polysaccharide-23 11/30/2002, 10/13/2018  . Td 11/30/2002, 09/14/2013  . Zoster 02/28/2006    Conditions to be addressed/monitored:  {CCM ASSESSMENT DZ OPTIONS:25047}  There are no care plans to display for this patient.   Medication Assistance: {MEDASSISTANCEINFO:25044}  Patient's preferred pharmacy is:  [facility] [address], [place] [address] [place] [address] Phone: [phone] Fax: [phone]  Uses pill box? {Yes or If no, why not?:20788} Pt endorses ***% compliance  We discussed: {Pharmacy options:24294}  Plan: {US Pharmacy NGIT:19597}    Follow Up:  {FOLLOWUP:24991}  Plan: {CM FOLLOW UP PLAN:25073}  ***

## 2021-03-27 NOTE — Progress Notes (Signed)
Subjective:    Patient ID: Nicholas Frye, male    DOB: [birth year], 87 y.o.   MRN: [id number]  HPI The patient is here for an acute visit for neck pain, headaches at night and ear wax build up.  Chronic neck pain - he has been seen for his chronic neck pain in the past.  Xray from July 2021 showed similar advanced lower cervical degenerative changes most severe at C5-6 and C6-7.  He has taken relafen in the past.   His pain now is much worse.  The pain comes up to his head.  At night the pain is much worse than during the day.  He has more headaches at night and more upper back and neck pain during the day.  He has been taking advil which helps a little, but not enough.  He states the Relafen did not help when he took that.  The ache in his shoulder has been going on for > 1 week.  The increased pain has been going on over the past few days.  No pain in arms or N/T or weakness.  He denies any injuries or changes that may have caused this.   He did have skin cancer surgery on the top of his head by the [facility] recently.  He does not think this is related.    He had his hearing aids checked recently and he was told he had excessive ear wax and needs it cleaned out.  He denies any ear pain.  It is hard for him to tell if his hearing is not as good, but he is concerned because he knows it can interfere with his hearing.   Medications and allergies reviewed with patient and updated if appropriate.  Patient Active Problem List   Diagnosis Date Noted  . SCC (squamous cell carcinoma), arm, right 10/01/2020  . Hearing loss due to cerumen impaction, bilateral 09/11/2020  . Cervical spondylosis without myelopathy 06/11/2020  . Irritable bowel syndrome with constipation 08/11/2017  . DDD (degenerative disc disease), lumbar 07/27/2016  . GERD (gastroesophageal reflux disease) 03/01/2014  . Hyperlipidemia with target LDL less than 100 03/01/2014  . Routine health maintenance 02/15/2012  . PVD  04/30/2008    Current Outpatient Medications on File Prior to Visit  Medication Sig Dispense Refill  . EPINEPHrine (EPIPEN) 0.3 mg/0.3 mL IJ SOAJ injection Inject 0.3 mLs (0.3 mg total) into the muscle once. 1 Device 3  . Multiple Vitamins-Minerals (PRESERVISION AREDS PO) Take by mouth.      No current facility-administered medications on file prior to visit.    Past Medical History:  Diagnosis Date  . Elevated prostate specific antigen (PSA)    aged out  . GERD (gastroesophageal reflux disease)   . Lumbago   . Other voice and resonance disorders    damage from reflux  . Personal history of colonic polyps   . Unspecified tinnitus    resolved    Past Surgical History:  Procedure Laterality Date  . CATARACT EXTRACTION Bilateral 11/30/1998  . CATARACT EXTRACTION Bilateral 2000  . CATARACT EXTRACTION W/ INTRAOCULAR LENS  IMPLANT, BILATERAL    . punch biopsy of a lesion on forehead    . TONSILLECTOMY    . transrectal prostate biopsies that were negative      Social History   Socioeconomic History  . Marital status: Married    Spouse name: Not on file  . Number of children: 2  . Years of education: 53  .  Highest education level: Not on file  Occupational History    Comment: retired-still works at [employer]  . Smoking status: Never Smoker  . Smokeless tobacco: Never Used  Substance and Sexual Activity  . Alcohol use: Yes    Alcohol/week: 2.0 standard drinks    Types: 2 Standard drinks or equivalent per week    Comment: cocktail before dinner  . Drug use: No  . Sexual activity: Never  Other Topics Concern  . Not on file  Social History Narrative   HSG. Married '59. 2 daughters - '68, '71; 2 grandsons, 1 granddaughter, 2 step-grandchildren. Work: retired from [employer], works at [employer] 2 days /week.  End of life: DNR, no prolonged mechanical ventilation, no heroic or futile measures.    Social Determinants of Health   Financial  Resource Strain: Not on file  Food Insecurity: Not on file  Transportation Needs: Not on file  Physical Activity: Not on file  Stress: Not on file  Social Connections: Not on file    Family History  Problem Relation Age of Onset  . Peripheral vascular disease Mother   . Other Mother        bilateral Leg amputation  . Coronary artery disease Father   . Heart attack Father   . Heart disease Father        heart failure/ acute MI  . Peripheral vascular disease Sister   . Coronary artery disease Brother        valve replaced  . Heart disease Brother        MI x2, CABG, AVR  . Cancer Neg Hx        colon or prostate  . Diabetes Neg Hx     Review of Systems  Constitutional: Negative for fever.  HENT: Positive for hearing loss (Chronic). Negative for ear pain.        Dry mouth  Musculoskeletal: Positive for neck pain and neck stiffness.  Neurological: Positive for headaches. Negative for dizziness, weakness, light-headedness and numbness.       Objective:   Vitals:   03/28/21 0747  BP: 116/74  Pulse: 81  Temp: 98.2 F (36.8 C)  SpO2: 98%   BP Readings from Last 3 Encounters:  03/28/21 116/74  09/30/20 116/78  09/23/20 136/82   Wt Readings from Last 3 Encounters:  03/28/21 147 lb 9.6 oz (67 kg)  09/30/20 148 lb (67.1 kg)  09/11/20 148 lb (67.1 kg)   Body mass index is 23.82 kg/m.   Physical Exam Constitutional:      General: He is not in acute distress.    Appearance: Normal appearance. He is not ill-appearing.  HENT:     Head: Normocephalic and atraumatic.     Right Ear: Tympanic membrane, ear canal and external ear normal. There is no impacted cerumen (Minimal cerumen).     Left Ear: Tympanic membrane, ear canal and external ear normal. There is impacted cerumen.  Musculoskeletal:        General: No tenderness (No tenderness with palpation posterior neck, cervical spine, upper back or posterior head) or deformity.     Right lower leg: No edema.     Left  lower leg: No edema.     Comments: Significantly decreased range of motion of neck  Skin:    General: Skin is warm.     Findings: Lesion (Status post skin cancer surgery top of head that is healing well.  Staples in place.  No discharge, bleeding  or surrounding erythema) present.  Neurological:     Mental Status: He is alert.     Sensory: No sensory deficit (In bilateral upper extremities).     Motor: No weakness (In bilateral upper extremities).         PROCEDURE INDICATION: remove wax to visualize ear drum & prevent hearing difficulties  CONSENT:  Verbal  PROCEDURE NOTE:   LEFT EAR:  The CMA used a metal wax curette under direct vision with an otoscope to free the wax bolus from the ear wall and then successfully removed a small bit of wax. The ear was then irrigated with warm water to remove the remaining wax. POST- PROCEDURE EXAM: left TM successfully visualized and found to have no erythema     Assessment & Plan:    Acute on chronic neck pain, cervicogenic headaches: Acute on chronic neck pain-flare in the past week or so Advil and nabumetone have not helped Deferred physical therapy Reviewed x-ray from July 2021 Prednisone 20 mg daily with food x5 days Tizanidine 2 mg at bedtime as needed Advised him to hold Advil while taking the prednisone Call or return next week if there is no improvement  Impacted cerumen, left ear: Acute Impacted cerumen without ear pain, questionable change in hearing Successfully cleaned out, which she tolerated well    This visit occurred during the SARS-CoV-2 public health emergency.  Safety protocols were in place, including screening questions prior to the visit, additional usage of staff PPE, and extensive cleaning of exam room while observing appropriate contact time as indicated for disinfecting solutions.

## 2021-03-28 ENCOUNTER — Ambulatory Visit (INDEPENDENT_AMBULATORY_CARE_PROVIDER_SITE_OTHER): Payer: Medicare Other | Admitting: Internal Medicine

## 2021-03-28 ENCOUNTER — Encounter: Payer: Self-pay | Admitting: Internal Medicine

## 2021-03-28 ENCOUNTER — Other Ambulatory Visit: Payer: Self-pay

## 2021-03-28 VITALS — BP 116/74 | HR 81 | Temp 98.2°F | Ht 66.0 in | Wt 147.6 lb

## 2021-03-28 DIAGNOSIS — H6122 Impacted cerumen, left ear: Secondary | ICD-10-CM

## 2021-03-28 DIAGNOSIS — M542 Cervicalgia: Secondary | ICD-10-CM | POA: Diagnosis not present

## 2021-03-28 DIAGNOSIS — G4486 Cervicogenic headache: Secondary | ICD-10-CM

## 2021-03-28 MED ORDER — TIZANIDINE HCL 2 MG PO TABS: 2.0000 mg | ORAL_TABLET | Freq: Every day | ORAL | 0 refills | Status: DC

## 2021-03-28 MED ORDER — PREDNISONE 20 MG PO TABS: 20.0000 mg | ORAL_TABLET | Freq: Every day | ORAL | 0 refills | Status: DC

## 2021-03-28 NOTE — Patient Instructions (Addendum)
Your left ear was cleaned out today.   Medications changes include :   Prednisone 20 mg daily with food for 5 days.  Do not take any advil while taking the prednisone. Tizanidine ( muscle relaxer) nightly as needed.  Your prescription(s) have been submitted to your pharmacy. Please take as directed and contact our office if you believe you are having problem(s) with the medication(s).   Please call or come in if there is no improvement in your symptoms.

## 2021-04-02 NOTE — Progress Notes (Signed)
Subjective:    Patient ID: Nicholas Frye, male    DOB: [birth year], 87 y.o.   MRN: [id number]  HPI The patient is here for an acute visit.  He was here last week - was having neck pain, upper back pain that radiates up to his posterior head.  No arm pain or weakness.  No relief with advil, relafen.  I prescribed prednisone 20 mg qd x 5 days, tizanidine 2 mg HS prn.     The prednisone worked and during the day he felt good and had no pain.  The tizanidine has not helped and he did not like how it made him feel.  His shoulders do not hurt.  His neck feels good right now.  He is starting to feel the head pain now since he did not take the prednisone today.  Overall it is better.   Head pain is dull.         Medications and allergies reviewed with patient and updated if appropriate.  Patient Active Problem List   Diagnosis Date Noted  . SCC (squamous cell carcinoma), arm, right 10/01/2020  . Hearing loss due to cerumen impaction, bilateral 09/11/2020  . Cervical spondylosis without myelopathy 06/11/2020  . Irritable bowel syndrome with constipation 08/11/2017  . DDD (degenerative disc disease), lumbar 07/27/2016  . GERD (gastroesophageal reflux disease) 03/01/2014  . Hyperlipidemia with target LDL less than 100 03/01/2014  . Routine health maintenance 02/15/2012  . PVD 04/30/2008    Current Outpatient Medications on File Prior to Visit  Medication Sig Dispense Refill  . EPINEPHrine (EPIPEN) 0.3 mg/0.3 mL IJ SOAJ injection Inject 0.3 mLs (0.3 mg total) into the muscle once. 1 Device 3  . Multiple Vitamins-Minerals (PRESERVISION AREDS PO) Take by mouth.      No current facility-administered medications on file prior to visit.    Past Medical History:  Diagnosis Date  . Elevated prostate specific antigen (PSA)    aged out  . GERD (gastroesophageal reflux disease)   . Lumbago   . Other voice and resonance disorders    damage from reflux  . Personal history of  colonic polyps   . Unspecified tinnitus    resolved    Past Surgical History:  Procedure Laterality Date  . CATARACT EXTRACTION Bilateral 11/30/1998  . CATARACT EXTRACTION Bilateral 2000  . CATARACT EXTRACTION W/ INTRAOCULAR LENS  IMPLANT, BILATERAL    . punch biopsy of a lesion on forehead    . TONSILLECTOMY    . transrectal prostate biopsies that were negative      Social History   Socioeconomic History  . Marital status: Married    Spouse name: Not on file  . Number of children: 2  . Years of education: 65  . Highest education level: Not on file  Occupational History    Comment: retired-still works at [employer]  . Smoking status: Never Smoker  . Smokeless tobacco: Never Used  Substance and Sexual Activity  . Alcohol use: Yes    Alcohol/week: 2.0 standard drinks    Types: 2 Standard drinks or equivalent per week    Comment: cocktail before dinner  . Drug use: No  . Sexual activity: Never  Other Topics Concern  . Not on file  Social History Narrative   HSG. Married '59. 2 daughters - '68, '71; 2 grandsons, 1 granddaughter, 2 step-grandchildren. Work: retired from [employer], works at [employer] 2 days /week.  End of life: DNR, no prolonged  mechanical ventilation, no heroic or futile measures.    Social Determinants of Health   Financial Resource Strain: Not on file  Food Insecurity: Not on file  Transportation Needs: Not on file  Physical Activity: Not on file  Stress: Not on file  Social Connections: Not on file    Family History  Problem Relation Age of Onset  . Peripheral vascular disease Mother   . Other Mother        bilateral Leg amputation  . Coronary artery disease Father   . Heart attack Father   . Heart disease Father        heart failure/ acute MI  . Peripheral vascular disease Sister   . Coronary artery disease Brother        valve replaced  . Heart disease Brother        MI x2, CABG, AVR  . Cancer Neg Hx         colon or prostate  . Diabetes Neg Hx     Review of Systems     Objective:   Vitals:   04/03/21 0752  BP: 126/72  Pulse: 72  Temp: 98.7 F (37.1 C)  SpO2: 96%   BP Readings from Last 3 Encounters:  04/03/21 126/72  03/28/21 116/74  09/30/20 116/78   Wt Readings from Last 3 Encounters:  04/03/21 144 lb (65.3 kg)  03/28/21 147 lb 9.6 oz (67 kg)  09/30/20 148 lb (67.1 kg)   Body mass index is 23.24 kg/m.   Physical Exam Constitutional:      General: He is not in acute distress.    Appearance: He is well-developed. He is not ill-appearing.  HENT:     Head: Normocephalic and atraumatic.  Musculoskeletal:        General: No tenderness (upper back and neck muscles.  no tenderness posterior head).     Comments: Decreased ROM of neck  Skin:    General: Skin is dry.  Neurological:     Mental Status: He is alert.     Cranial Nerves: No cranial nerve deficit, dysarthria or facial asymmetry.     Sensory: No sensory deficit.     Motor: No weakness (in b/l UE).            Assessment & Plan:    Occipital neuraliga: acute He has neck stiffness, dec ROM and pain in his neck and upper back.  Also with symptoms of occipital neuralgia Improvement with prednisone 20 mg daily --- will continue for an additional 5 more days Declined PT Can take tylenol or nabumetone after completing prednisone Advised neuro eval if symptoms persist   This visit occurred during the SARS-CoV-2 public health emergency.  Safety protocols were in place, including screening questions prior to the visit, additional usage of staff PPE, and extensive cleaning of exam room while observing appropriate contact time as indicated for disinfecting solutions.

## 2021-04-03 ENCOUNTER — Ambulatory Visit (INDEPENDENT_AMBULATORY_CARE_PROVIDER_SITE_OTHER): Payer: Medicare Other | Admitting: Internal Medicine

## 2021-04-03 ENCOUNTER — Other Ambulatory Visit: Payer: Self-pay

## 2021-04-03 ENCOUNTER — Encounter: Payer: Self-pay | Admitting: Internal Medicine

## 2021-04-03 VITALS — BP 126/72 | HR 72 | Temp 98.7°F | Ht 66.0 in | Wt 144.0 lb

## 2021-04-03 DIAGNOSIS — M5481 Occipital neuralgia: Secondary | ICD-10-CM | POA: Diagnosis not present

## 2021-04-03 DIAGNOSIS — M47812 Spondylosis without myelopathy or radiculopathy, cervical region: Secondary | ICD-10-CM

## 2021-04-03 MED ORDER — NABUMETONE 500 MG PO TABS: 500.0000 mg | ORAL_TABLET | Freq: Two times a day (BID) | ORAL | 0 refills | Status: DC | PRN

## 2021-04-03 MED ORDER — PREDNISONE 20 MG PO TABS: 20.0000 mg | ORAL_TABLET | Freq: Every day | ORAL | 0 refills | Status: DC

## 2021-04-03 NOTE — Patient Instructions (Signed)
Take another 5 days of the prednisone   If your symptoms persist I would recommend seeing a neurologist.     Occipital Neuralgia  Occipital neuralgia is a type of headache that causes brief episodes of very bad pain in the back of the head. Pain from occipital neuralgia may spread (radiate) to other parts of the head. These headaches may be caused by irritation of the nerves that leave the spinal cord high up in the neck, just below the base of the skull (occipital nerves). The occipital nerves transmit sensations from the back of the head, the top of the head, and the areas behind the ears. What are the causes? This condition can occur without any known cause (primary headache syndrome). In other cases, this condition is caused by pressure on or irritation of one of the two occipital nerves. Pressure and irritation may be due to:  Muscle spasm in the neck.  Neck injury.  Wear and tear of the vertebrae in the neck (osteoarthritis).  Disease of the disks that separate the vertebrae.  Swollen blood vessels that put pressure on the occipital nerves.  Infections.  Tumors.  Diabetes. What are the signs or symptoms? This condition causes brief burning, stabbing, electric, shocking, or shooting pain in the back of the head that can radiate to the top of the head. It can happen on one side or both sides of the head. It can also cause:  Pain behind the eye.  Pain triggered by neck movement or hair brushing.  Scalp tenderness.  Aching in the back of the head between episodes of very bad pain.  Pain that gets worse with exposure to bright lights. How is this diagnosed? Your health care provider may diagnose the condition based on a physical exam and your symptoms. Tests may be done, such as:  Imaging studies of the brain and neck (cervical spine), such as an MRI or CT scan. These look for causes of pinched nerves.  Applying pressure to the nerves in the neck to try to re-create the  pain.  Injection of numbing medicine into the occipital nerve areas to see if pain goes away (diagnostic nerve block).   How is this treated? Treatment for this condition may begin with simple measures, such as:  Rest.  Massage.  Applying heat or cold to the area.  Over-the-counter pain relievers. If these measures do not work, you may need other treatments, including:  Medicines, such as: ? Prescription-strength anti-inflammatory medicines. ? Muscle relaxants. ? Anti-seizure medicines, which can relieve pain. ? Antidepressants, which can relieve pain. ? Injected medicines, such as medicines that numb the area (local anesthetic) and steroids.  Pulsed radiofrequency ablation. This is when wires are implanted to deliver electrical impulses that block pain signals from the occipital nerve.  Surgery to relieve nerve pressure.  Physical therapy. Follow these instructions at home: Managing pain  Avoid any activities that cause pain.  Rest when you have an attack of pain.  Try gentle massage to relieve pain.  Try a different pillow or sleeping position.  If directed, apply heat to the affected area as often as told by your health care provider. Use the heat source that your health care provider recommends, such as a moist heat pack or a heating pad. ? Place a towel between your skin and the heat source. ? Leave the heat on for 20-30 minutes. ? Remove the heat if your skin turns bright red. This is especially important if you are unable to  feel pain, heat, or cold. You have a greater risk of getting burned.  If directed, put ice on the back of your head and neck area. To do this: ? Put ice in a plastic bag. ? Place a towel between your skin and the bag. ? Leave the ice on for 20 minutes, 2-3 times a day. ? Remove the ice if your skin turns bright red. This is very important. If you cannot feel pain, heat, or cold, you have a greater risk of damage to the area.      General  instructions  Take over-the-counter and prescription medicines only as told by your health care provider.  Avoid things that make your symptoms worse, such as bright lights.  Try to stay active. Get regular exercise that does not cause pain. Ask your health care provider to suggest safe exercises for you.  Work with a physical therapist to learn stretching exercises you can do at home.  Practice good posture.  Keep all follow-up visits. This is important. Contact a health care provider if:  Your medicine is not working.  You have new or worsening symptoms. Get help right away if:  You have very bad head pain that does not go away.  You have a sudden change in vision, balance, or speech. These symptoms may represent a serious problem that is an emergency. Do not wait to see if the symptoms will go away. Get medical help right away. Call your local emergency services (911 in the [place] Do not drive yourself to the hospital. Summary  Occipital neuralgia is a type of headache that causes brief episodes of very bad pain in the back of the head.  Pain from occipital neuralgia may spread (radiate) to other parts of the head.  Treatment for this condition includes rest, massage, and medicines. This information is not intended to replace advice given to you by your health care provider. Make sure you discuss any questions you have with your health care provider. Document Revised: 09/15/2020 Document Reviewed: 09/15/2020 Elsevier Patient Education  2021 [employer].

## 2021-04-11 ENCOUNTER — Encounter: Payer: Self-pay | Admitting: Internal Medicine

## 2021-04-14 ENCOUNTER — Encounter: Payer: Self-pay | Admitting: Internal Medicine

## 2021-04-18 ENCOUNTER — Encounter: Payer: Self-pay | Admitting: Internal Medicine

## 2021-04-18 DIAGNOSIS — M542 Cervicalgia: Secondary | ICD-10-CM

## 2021-04-22 ENCOUNTER — Encounter: Payer: Self-pay | Admitting: Internal Medicine

## 2021-04-22 ENCOUNTER — Ambulatory Visit: Payer: Medicare Other | Admitting: Internal Medicine

## 2021-04-29 ENCOUNTER — Ambulatory Visit: Payer: Medicare Other | Admitting: Internal Medicine

## 2021-04-29 ENCOUNTER — Telehealth: Payer: Self-pay | Admitting: Internal Medicine

## 2021-04-29 NOTE — Telephone Encounter (Signed)
[facility] FYI:  ---Caller states her husband is having pain in his left ear. Symptoms started 2 days ago. No cold symptoms. She said he cannot stand to put his hearing aid in.  See PCP within 3 days

## 2021-04-29 NOTE — Telephone Encounter (Signed)
Pt is scheduled for tomorrow 6/1 @ 11.20am

## 2021-04-30 ENCOUNTER — Encounter: Payer: Self-pay | Admitting: Internal Medicine

## 2021-04-30 ENCOUNTER — Ambulatory Visit (INDEPENDENT_AMBULATORY_CARE_PROVIDER_SITE_OTHER): Payer: Medicare Other | Admitting: Internal Medicine

## 2021-04-30 ENCOUNTER — Other Ambulatory Visit: Payer: Self-pay

## 2021-04-30 VITALS — BP 118/78 | HR 91 | Temp 98.8°F | Resp 16 | Ht 66.0 in | Wt 142.0 lb

## 2021-04-30 DIAGNOSIS — H60332 Swimmer's ear, left ear: Secondary | ICD-10-CM

## 2021-04-30 MED ORDER — OFLOXACIN 300 MG PO TABS: 300.0000 mg | ORAL_TABLET | Freq: Two times a day (BID) | ORAL | 0 refills | Status: AC

## 2021-04-30 NOTE — Patient Instructions (Signed)
Otitis Externa  Otitis externa is an infection of the outer ear canal. The outer ear canal is the area between the outside of the ear and the eardrum. Otitis externa is sometimes called swimmer's ear. What are the causes? Common causes of this condition include:  Swimming in dirty water.  Moisture in the ear.  An injury to the inside of the ear.  An object stuck in the ear.  A cut or scrape on the outside of the ear. What increases the risk? You are more likely to develop this condition if you go swimming often. What are the signs or symptoms? The first symptom of this condition is often itching in the ear. Later symptoms of the condition include:  Swelling of the ear.  Redness in the ear.  Ear pain. The pain may get worse when you pull on your ear.  Pus coming from the ear. How is this diagnosed? This condition may be diagnosed by examining the ear and testing fluid from the ear for bacteria and funguses. How is this treated? This condition may be treated with:  Antibiotic ear drops. These are often given for 10-14 days.  Medicines to reduce itching and swelling. Follow these instructions at home:  If you were prescribed antibiotic ear drops, use them as told by your health care provider. Do not stop using the antibiotic even if your condition improves.  Take over-the-counter and prescription medicines only as told by your health care provider.  Avoid getting water in your ears as told by your health care provider. This may include avoiding swimming or water sports for a few days.  Keep all follow-up visits as told by your health care provider. This is important. How is this prevented?  Keep your ears dry. Use the corner of a towel to dry your ears after you swim or bathe.  Avoid scratching or putting things in your ear. Doing these things can damage the ear canal or remove the protective wax that lines it, which makes it easier for bacteria and funguses to  grow.  Avoid swimming in lakes, polluted water, or pools that may not have enough chlorine. Contact a health care provider if:  You have a fever.  Your ear is still red, swollen, painful, or draining pus after 3 days.  Your redness, swelling, or pain gets worse.  You have a severe headache.  You have redness, swelling, pain, or tenderness in the area behind your ear. Summary  Otitis externa is an infection of the outer ear canal.  Common causes include swimming in dirty water, moisture in the ear, or a cut or scrape in the ear.  Symptoms include pain, redness, and swelling of the ear.  If you were prescribed antibiotic ear drops, use them as told by your health care provider. Do not stop using the antibiotic even if your condition improves. This information is not intended to replace advice given to you by your health care provider. Make sure you discuss any questions you have with your health care provider. Document Revised: 04/22/2018 Document Reviewed: 04/22/2018 Elsevier Patient Education  2021 Elsevier Inc.  

## 2021-04-30 NOTE — Progress Notes (Signed)
Subjective:  Patient ID: Nicholas Frye, male    DOB: 1930-11-18  Age: 87 y.o. MRN: [id number]  CC: Ear Pain  This visit occurred during the SARS-CoV-2 public health emergency.  Safety protocols were in place, including screening questions prior to the visit, additional usage of staff PPE, and extensive cleaning of exam room while observing appropriate contact time as indicated for disinfecting solutions.    HPI Nicholas Frye presents for f/up - He complains of a 4-day history of left-sided earache with drainage from the left ear.  He saw someone else and has been using Cipro/dexamethasone eardrops without much improvement.  Outpatient Medications Prior to Visit  Medication Sig Dispense Refill  . EPINEPHrine (EPIPEN) 0.3 mg/0.3 mL IJ SOAJ injection Inject 0.3 mLs (0.3 mg total) into the muscle once. 1 Device 3  . Multiple Vitamins-Minerals (PRESERVISION AREDS PO) Take by mouth.     . nabumetone (RELAFEN) 500 MG tablet Take 1 tablet (500 mg total) by mouth 2 (two) times daily as needed. 180 tablet 0  . predniSONE (DELTASONE) 20 MG tablet Take 1 tablet (20 mg total) by mouth daily with breakfast. 5 tablet 0   No facility-administered medications prior to visit.    ROS Review of Systems  Constitutional: Negative for chills, fatigue and fever.  HENT: Positive for ear discharge and ear pain. Negative for drooling, facial swelling, sinus pressure, sore throat and trouble swallowing.   Eyes: Negative.   Respiratory: Negative for cough, chest tightness, shortness of breath and wheezing.   Cardiovascular: Negative for chest pain, palpitations and leg swelling.  Gastrointestinal: Negative for abdominal pain, diarrhea, nausea and vomiting.  Endocrine: Negative.   Genitourinary: Negative.  Negative for difficulty urinating.  Musculoskeletal: Positive for arthralgias, back pain and neck pain. Negative for myalgias.  Skin: Negative.  Negative for color change.  Neurological: Negative.   Negative for dizziness, weakness, light-headedness and headaches.  Hematological: Negative for adenopathy. Does not bruise/bleed easily.  Psychiatric/Behavioral: Negative.     Objective:  BP 118/78 (BP Location: Left Arm, Patient Position: Sitting, Cuff Size: Normal)   Pulse 91   Temp 98.8 F (37.1 C) (Oral)   Resp 16   Ht 5\' 6"  (1.676 m)   Wt 142 lb (64.4 kg)   SpO2 96%   BMI 22.92 kg/m   BP Readings from Last 3 Encounters:  04/30/21 118/78  04/03/21 126/72  03/28/21 116/74    Wt Readings from Last 3 Encounters:  04/30/21 142 lb (64.4 kg)  04/03/21 144 lb (65.3 kg)  03/28/21 147 lb 9.6 oz (67 kg)    Physical Exam Vitals reviewed.  Constitutional:      Appearance: He is not ill-appearing.  HENT:     Right Ear: Hearing, tympanic membrane, ear canal and external ear normal.     Left Ear: Hearing, tympanic membrane and external ear normal. Swelling present.  No middle ear effusion. There is no impacted cerumen. No foreign body.     Ears:     Comments: (L) EAC is mildly swollen with a purulent exudate    Nose: Nose normal.     Mouth/Throat:     Mouth: Mucous membranes are moist.  Eyes:     General: No scleral icterus.    Conjunctiva/sclera: Conjunctivae normal.  Cardiovascular:     Rate and Rhythm: Normal rate and regular rhythm.     Heart sounds: No murmur heard.   Pulmonary:     Effort: Pulmonary effort is normal.  Breath sounds: No stridor. No wheezing, rhonchi or rales.  Chest:  Breasts:     Right: No supraclavicular adenopathy.     Left: No supraclavicular adenopathy.    Abdominal:     General: Abdomen is flat. There is no distension.  Musculoskeletal:        General: Normal range of motion.     Cervical back: Neck supple.     Right lower leg: No edema.     Left lower leg: No edema.  Lymphadenopathy:     Head:     Right side of head: No preauricular or occipital adenopathy.     Left side of head: No preauricular or occipital adenopathy.      Cervical: No cervical adenopathy.     Right cervical: No superficial cervical adenopathy.    Left cervical: No superficial cervical adenopathy.     Upper Body:     Right upper body: No supraclavicular adenopathy.     Left upper body: No supraclavicular adenopathy.  Skin:    General: Skin is warm and dry.  Neurological:     General: No focal deficit present.     Mental Status: He is alert.     Lab Results  Component Value Date   WBC 7.8 09/23/2020   HGB 15.4 09/23/2020   HCT 45.2 09/23/2020   PLT 160.0 09/23/2020   GLUCOSE 85 09/11/2020   CHOL 197 09/11/2020   TRIG 43.0 09/11/2020   HDL 93.70 09/11/2020   LDLDIRECT 101.3 04/03/2011   LDLCALC 95 09/11/2020   ALT 13 09/11/2020   AST 16 09/11/2020   NA 140 09/11/2020   K 3.8 09/11/2020   CL 105 09/11/2020   CREATININE 1.24 09/11/2020   BUN 18 09/11/2020   CO2 27 09/11/2020   TSH 5.10 (H) 09/11/2020   PSA 2.03 04/03/2011   HGBA1C 5.3 08/17/2019    DG Finger Index Right  Result Date: 10/13/2018 CLINICAL DATA:  Fall.  Index finger injury. EXAM: RIGHT INDEX FINGER 2+V COMPARISON:  No prior. FINDINGS: Diffuse soft tissue swelling. Tiny bony density noted posterior to the distal aspect of the proximal phalanx of the right second digit. This could represent tiny fracture chip. No other focal abnormality identified. IMPRESSION: Diffuse soft tissue swelling. Tiny bony density noted along the posterior aspect of the distal phalanx of the right second digit. This could represent a tiny fracture chip. Electronically Signed   By: Marcello Moores  Register   On: 10/13/2018 12:50    Assessment & Plan:   [name] was seen today for ear pain.  Diagnoses and all orders for this visit:  Acute swimmer's ear of left side- He has not had much improvement with the eardrops.  This is concerning for Pseudomonas.  I recommended that he take a 7-day course of a fluoroquinolone.  If that does not cleared up then I will treat for fungal causes. -      ofloxacin (FLOXIN) 300 MG tablet; Take 1 tablet (300 mg total) by mouth 2 (two) times daily for 7 days.   I am having Nicholas Frye "Nicholas Frye" start on ofloxacin. I am also having him maintain his EPINEPHrine, Multiple Vitamins-Minerals (PRESERVISION AREDS PO), predniSONE, and nabumetone.  Meds ordered this encounter  Medications  . ofloxacin (FLOXIN) 300 MG tablet    Sig: Take 1 tablet (300 mg total) by mouth 2 (two) times daily for 7 days.    Dispense:  14 tablet    Refill:  0     Follow-up: Return  in about 3 weeks (around 05/21/2021).  [name], MD

## 2021-05-05 DIAGNOSIS — M25551 Pain in right hip: Secondary | ICD-10-CM | POA: Diagnosis not present

## 2021-05-05 DIAGNOSIS — M25552 Pain in left hip: Secondary | ICD-10-CM | POA: Diagnosis not present

## 2021-05-06 DIAGNOSIS — H35372 Puckering of macula, left eye: Secondary | ICD-10-CM | POA: Diagnosis not present

## 2021-05-06 DIAGNOSIS — H353132 Nonexudative age-related macular degeneration, bilateral, intermediate dry stage: Secondary | ICD-10-CM | POA: Diagnosis not present

## 2021-05-13 ENCOUNTER — Ambulatory Visit (INDEPENDENT_AMBULATORY_CARE_PROVIDER_SITE_OTHER): Payer: Medicare Other | Admitting: Internal Medicine

## 2021-05-13 ENCOUNTER — Encounter: Payer: Self-pay | Admitting: Internal Medicine

## 2021-05-13 ENCOUNTER — Other Ambulatory Visit: Payer: Self-pay

## 2021-05-13 VITALS — BP 118/70 | HR 83 | Temp 98.7°F | Ht 66.0 in | Wt 145.0 lb

## 2021-05-13 DIAGNOSIS — H60332 Swimmer's ear, left ear: Secondary | ICD-10-CM

## 2021-05-13 DIAGNOSIS — H9192 Unspecified hearing loss, left ear: Secondary | ICD-10-CM | POA: Insufficient documentation

## 2021-05-14 DIAGNOSIS — H9012 Conductive hearing loss, unilateral, left ear, with unrestricted hearing on the contralateral side: Secondary | ICD-10-CM | POA: Diagnosis not present

## 2021-05-14 DIAGNOSIS — H7202 Central perforation of tympanic membrane, left ear: Secondary | ICD-10-CM | POA: Diagnosis not present

## 2021-05-14 DIAGNOSIS — H6122 Impacted cerumen, left ear: Secondary | ICD-10-CM | POA: Diagnosis not present

## 2021-05-14 DIAGNOSIS — H60332 Swimmer's ear, left ear: Secondary | ICD-10-CM | POA: Diagnosis not present

## 2021-05-15 NOTE — Progress Notes (Signed)
Subjective:  Patient ID: Nicholas Frye, male    DOB: Feb 20, 1930  Age: 87 y.o. MRN: [id number]  CC: Follow-up (Issues with hearing out of left ear)  This visit occurred during the SARS-CoV-2 public health emergency.  Safety protocols were in place, including screening questions prior to the visit, additional usage of staff PPE, and extensive cleaning of exam room while observing appropriate contact time as indicated for disinfecting solutions.    HPI Nicholas Frye presents for f/up -  He tells me the pain and swelling in the left ear has resolved.  He now complains of a decreased level of hearing on the left side.  He is wearing a hearing aid on the right side.  Outpatient Medications Prior to Visit  Medication Sig Dispense Refill   EPINEPHrine (EPIPEN) 0.3 mg/0.3 mL IJ SOAJ injection Inject 0.3 mLs (0.3 mg total) into the muscle once. 1 Device 3   Multiple Vitamins-Minerals (PRESERVISION AREDS PO) Take by mouth.      nabumetone (RELAFEN) 500 MG tablet Take 1 tablet (500 mg total) by mouth 2 (two) times daily as needed. 180 tablet 0   predniSONE (DELTASONE) 20 MG tablet Take 1 tablet (20 mg total) by mouth daily with breakfast. 5 tablet 0   No facility-administered medications prior to visit.    ROS Review of Systems  Constitutional: Negative.   HENT:  Positive for hearing loss. Negative for ear discharge, ear pain, sinus pressure, sore throat and tinnitus.   Respiratory:  Negative for cough.   Cardiovascular:  Negative for chest pain, palpitations and leg swelling.  Gastrointestinal:  Negative for abdominal pain.  Genitourinary: Negative.  Negative for difficulty urinating.  Musculoskeletal: Negative.   Skin: Negative.    Objective:  BP 118/70 (BP Location: Left Arm, Patient Position: Sitting, Cuff Size: Large)   Pulse 83   Temp 98.7 F (37.1 C) (Oral)   Ht 5\' 6"  (1.676 m)   Wt 145 lb (65.8 kg)   SpO2 97%   BMI 23.40 kg/m   BP Readings from Last 3 Encounters:   05/13/21 118/70  04/30/21 118/78  04/03/21 126/72    Wt Readings from Last 3 Encounters:  05/13/21 145 lb (65.8 kg)  04/30/21 142 lb (64.4 kg)  04/03/21 144 lb (65.3 kg)    Physical Exam Vitals reviewed.  HENT:     Right Ear: Tympanic membrane, ear canal and external ear normal. Decreased hearing noted.     Left Ear: Tympanic membrane, ear canal and external ear normal. Decreased hearing noted.  Eyes:     General: No scleral icterus. Cardiovascular:     Rate and Rhythm: Normal rate.  Pulmonary:     Effort: No respiratory distress.     Breath sounds: No wheezing.  Abdominal:     General: Abdomen is flat.  Musculoskeletal:        General: Normal range of motion.     Cervical back: Neck supple.     Right lower leg: No edema.     Left lower leg: No edema.  Lymphadenopathy:     Head:     Right side of head: No preauricular, posterior auricular or occipital adenopathy.     Left side of head: No preauricular, posterior auricular or occipital adenopathy.     Cervical: No cervical adenopathy.  Skin:    General: Skin is warm.     Findings: No rash.  Neurological:     General: No focal deficit present.     Mental  Status: He is alert.  Psychiatric:        Mood and Affect: Mood normal.    Lab Results  Component Value Date   WBC 7.8 09/23/2020   HGB 15.4 09/23/2020   HCT 45.2 09/23/2020   PLT 160.0 09/23/2020   GLUCOSE 85 09/11/2020   CHOL 197 09/11/2020   TRIG 43.0 09/11/2020   HDL 93.70 09/11/2020   LDLDIRECT 101.3 04/03/2011   LDLCALC 95 09/11/2020   ALT 13 09/11/2020   AST 16 09/11/2020   NA 140 09/11/2020   K 3.8 09/11/2020   CL 105 09/11/2020   CREATININE 1.24 09/11/2020   BUN 18 09/11/2020   CO2 27 09/11/2020   TSH 5.10 (H) 09/11/2020   PSA 2.03 04/03/2011   HGBA1C 5.3 08/17/2019    DG Finger Index Right  Result Date: 10/13/2018 CLINICAL DATA:  Fall.  Index finger injury. EXAM: RIGHT INDEX FINGER 2+V COMPARISON:  No prior. FINDINGS: Diffuse soft  tissue swelling. Tiny bony density noted posterior to the distal aspect of the proximal phalanx of the right second digit. This could represent tiny fracture chip. No other focal abnormality identified. IMPRESSION: Diffuse soft tissue swelling. Tiny bony density noted along the posterior aspect of the distal phalanx of the right second digit. This could represent a tiny fracture chip. Electronically Signed   By: Marcello Moores  Register   On: 10/13/2018 12:50    Assessment & Plan:   Tien was seen today for follow-up.  Diagnoses and all orders for this visit:  Acute hearing loss, left -     Ambulatory referral to ENT  Acute swimmer's ear of left side- This has been adequately treated.  I have discontinued [name]'s predniSONE. I am also having him maintain his EPINEPHrine, Multiple Vitamins-Minerals (PRESERVISION AREDS PO), and nabumetone.  No orders of the defined types were placed in this encounter.    Follow-up: No follow-ups on file.  [name], MD

## 2021-05-21 DIAGNOSIS — H60332 Swimmer's ear, left ear: Secondary | ICD-10-CM | POA: Diagnosis not present

## 2021-05-21 DIAGNOSIS — H7202 Central perforation of tympanic membrane, left ear: Secondary | ICD-10-CM | POA: Diagnosis not present

## 2021-05-22 DIAGNOSIS — Z23 Encounter for immunization: Secondary | ICD-10-CM | POA: Diagnosis not present

## 2021-05-30 DIAGNOSIS — H66012 Acute suppurative otitis media with spontaneous rupture of ear drum, left ear: Secondary | ICD-10-CM | POA: Diagnosis not present

## 2021-06-10 ENCOUNTER — Ambulatory Visit: Payer: Medicare Other | Admitting: Internal Medicine

## 2021-06-12 ENCOUNTER — Other Ambulatory Visit: Payer: Self-pay

## 2021-06-12 ENCOUNTER — Ambulatory Visit (INDEPENDENT_AMBULATORY_CARE_PROVIDER_SITE_OTHER): Payer: Medicare Other

## 2021-06-12 VITALS — BP 118/80 | HR 64 | Temp 98.3°F | Ht 66.0 in | Wt 144.4 lb

## 2021-06-12 DIAGNOSIS — Z0000 Encounter for general adult medical examination without abnormal findings: Secondary | ICD-10-CM | POA: Diagnosis not present

## 2021-06-12 NOTE — Patient Instructions (Addendum)
Nicholas Frye , Thank you for taking time to come for your Medicare Wellness Visit. I appreciate your ongoing commitment to your health goals. Please review the following plan we discussed and let me know if I can assist you in the future.   Screening recommendations/referrals: Colonoscopy: not a candidate for colon cancer screening due to age Recommended yearly ophthalmology/optometry visit for glaucoma screening and checkup Recommended yearly dental visit for hygiene and checkup  Vaccinations: Influenza vaccine: 09/11/2020 Pneumococcal vaccine: 10/08/2014, 10/13/2018 Tdap vaccine: 10/03/2013; due every 10 years Shingles vaccine: never done;  Please call your insurance company to determine your out of pocket expense for the Shingrix vaccine. You may receive this vaccine at your local pharmacy. Covid-19:12/24/2019, 01/12/2020, 05/22/2021  Advanced directives: Please schedule your next Medicare Wellness Visit with your Nurse Health Advisor in 1 year by calling [phone].  Conditions/risks identified: Client understands the importance of follow-up with providers by attending scheduled visits and discussed goals to eat healthier, increase physical activity, exercise the brain, socialize more,  get enough rest and make time for laughter.  Next appointment: Please schedule your next Medicare Wellness Visit with your Nurse Health Advisor in 1 year by calling [phone].  Preventive Care 8 Years and Older, Male Preventive care refers to lifestyle choices and visits with your health care provider that can promote health and wellness. What does preventive care include? A yearly physical exam. This is also called an annual well check. Dental exams once or twice a year. Routine eye exams. Ask your health care provider how often you should have your eyes checked. Personal lifestyle choices, including: Daily care of your teeth and gums. Regular physical activity. Eating a healthy diet. Avoiding  tobacco and drug use. Limiting alcohol use. Practicing safe sex. Taking low doses of aspirin every day. Taking vitamin and mineral supplements as recommended by your health care provider. What happens during an annual well check? The services and screenings done by your health care provider during your annual well check will depend on your age, overall health, lifestyle risk factors, and family history of disease. Counseling  Your health care provider may ask you questions about your: Alcohol use. Tobacco use. Drug use. Emotional well-being. Home and relationship well-being. Sexual activity. Eating habits. History of falls. Memory and ability to understand (cognition). Work and work Statistician. Screening  You may have the following tests or measurements: Height, weight, and BMI. Blood pressure. Lipid and cholesterol levels. These may be checked every 5 years, or more frequently if you are over 44 years old. Skin check. Lung cancer screening. You may have this screening every year starting at age 40 if you have a 30-pack-year history of smoking and currently smoke or have quit within the past 15 years. Fecal occult blood test (FOBT) of the stool. You may have this test every year starting at age 37. Flexible sigmoidoscopy or colonoscopy. You may have a sigmoidoscopy every 5 years or a colonoscopy every 10 years starting at age 20. Prostate cancer screening. Recommendations will vary depending on your family history and other risks. Hepatitis C blood test. Hepatitis B blood test. Sexually transmitted disease (STD) testing. Diabetes screening. This is done by checking your blood sugar (glucose) after you have not eaten for a while (fasting). You may have this done every 1-3 years. Abdominal aortic aneurysm (AAA) screening. You may need this if you are a current or former smoker. Osteoporosis. You may be screened starting at age 43 if you are at high risk. Talk with  your health care  provider about your test results, treatment options, and if necessary, the need for more tests. Vaccines  Your health care provider may recommend certain vaccines, such as: Influenza vaccine. This is recommended every year. Tetanus, diphtheria, and acellular pertussis (Tdap, Td) vaccine. You may need a Td booster every 10 years. Zoster vaccine. You may need this after age 42. Pneumococcal 13-valent conjugate (PCV13) vaccine. One dose is recommended after age 68. Pneumococcal polysaccharide (PPSV23) vaccine. One dose is recommended after age 80. Talk to your health care provider about which screenings and vaccines you need and how often you need them. This information is not intended to replace advice given to you by your health care provider. Make sure you discuss any questions you have with your health care provider. Document Released: 12/13/2015 Document Revised: 08/05/2016 Document Reviewed: 09/17/2015 Elsevier Interactive Patient Education  2017 [place] Prevention in the Home Falls can cause injuries. They can happen to people of all ages. There are many things you can do to make your home safe and to help prevent falls. What can I do on the outside of my home? Regularly fix the edges of walkways and driveways and fix any cracks. Remove anything that might make you trip as you walk through a door, such as a raised step or threshold. Trim any bushes or trees on the path to your home. Use bright outdoor lighting. Clear any walking paths of anything that might make someone trip, such as rocks or tools. Regularly check to see if handrails are loose or broken. Make sure that both sides of any steps have handrails. Any raised decks and porches should have guardrails on the edges. Have any leaves, snow, or ice cleared regularly. Use sand or salt on walking paths during winter. Clean up any spills in your garage right away. This includes oil or grease spills. What can I do in the  bathroom? Use night lights. Install grab bars by the toilet and in the tub and shower. Do not use towel bars as grab bars. Use non-skid mats or decals in the tub or shower. If you need to sit down in the shower, use a plastic, non-slip stool. Keep the floor dry. Clean up any water that spills on the floor as soon as it happens. Remove soap buildup in the tub or shower regularly. Attach bath mats securely with double-sided non-slip rug tape. Do not have throw rugs and other things on the floor that can make you trip. What can I do in the bedroom? Use night lights. Make sure that you have a light by your bed that is easy to reach. Do not use any sheets or blankets that are too big for your bed. They should not hang down onto the floor. Have a firm chair that has side arms. You can use this for support while you get dressed. Do not have throw rugs and other things on the floor that can make you trip. What can I do in the kitchen? Clean up any spills right away. Avoid walking on wet floors. Keep items that you use a lot in easy-to-reach places. If you need to reach something above you, use a strong step stool that has a grab bar. Keep electrical cords out of the way. Do not use floor polish or wax that makes floors slippery. If you must use wax, use non-skid floor wax. Do not have throw rugs and other things on the floor that can make you trip.  What can I do with my stairs? Do not leave any items on the stairs. Make sure that there are handrails on both sides of the stairs and use them. Fix handrails that are broken or loose. Make sure that handrails are as long as the stairways. Check any carpeting to make sure that it is firmly attached to the stairs. Fix any carpet that is loose or worn. Avoid having throw rugs at the top or bottom of the stairs. If you do have throw rugs, attach them to the floor with carpet tape. Make sure that you have a light switch at the top of the stairs and the  bottom of the stairs. If you do not have them, ask someone to add them for you. What else can I do to help prevent falls? Wear shoes that: Do not have high heels. Have rubber bottoms. Are comfortable and fit you well. Are closed at the toe. Do not wear sandals. If you use a stepladder: Make sure that it is fully opened. Do not climb a closed stepladder. Make sure that both sides of the stepladder are locked into place. Ask someone to hold it for you, if possible. Clearly mark and make sure that you can see: Any grab bars or handrails. First and last steps. Where the edge of each step is. Use tools that help you move around (mobility aids) if they are needed. These include: Canes. Walkers. Scooters. Crutches. Turn on the lights when you go into a dark area. Replace any light bulbs as soon as they burn out. Set up your furniture so you have a clear path. Avoid moving your furniture around. If any of your floors are uneven, fix them. If there are any pets around you, be aware of where they are. Review your medicines with your doctor. Some medicines can make you feel dizzy. This can increase your chance of falling. Ask your doctor what other things that you can do to help prevent falls. This information is not intended to replace advice given to you by your health care provider. Make sure you discuss any questions you have with your health care provider. Document Released: 09/12/2009 Document Revised: 04/23/2016 Document Reviewed: 12/21/2014 Elsevier Interactive Patient Education  2017 [employer].

## 2021-06-12 NOTE — Progress Notes (Signed)
Subjective:   Nicholas Frye is a 87 y.o. male who presents for Medicare Annual/Subsequent preventive examination.  Review of Systems     Cardiac Risk Factors include: advanced age (>56men, >66 women);family history of premature cardiovascular disease;male gender     Objective:    Today's Vitals   06/12/21 0836  BP: 118/80  Pulse: 64  Temp: 98.3 F (36.8 C)  SpO2: 98%  Weight: 144 lb 6.4 oz (65.5 kg)  Height: 5\' 6"  (1.676 m)  PainSc: 0-No pain   Body mass index is 23.31 kg/m.  Advanced Directives 06/12/2021 08/11/2017 07/15/2017 03/28/2016 03/04/2015 03/04/2015  Does Patient Have a Medical Advance Directive? Yes Yes Yes Yes Yes Yes  Type of Advance Directive Living will;Healthcare Power of [place];Living will Living will;Healthcare Power of [place];Living will Healthcare Power of [place]  Does patient want to make changes to medical advance directive? No - Patient declined - - No - Patient declined No - Patient declined Yes - information given  Copy of [place] in Chart? No - copy requested Yes No - copy requested Yes Yes Yes    Current Medications (verified) Outpatient Encounter Medications as of 06/12/2021  Medication Sig   EPINEPHrine (EPIPEN) 0.3 mg/0.3 mL IJ SOAJ injection Inject 0.3 mLs (0.3 mg total) into the muscle once.   Multiple Vitamins-Minerals (PRESERVISION AREDS PO) Take by mouth.    [DISCONTINUED] nabumetone (RELAFEN) 500 MG tablet Take 1 tablet (500 mg total) by mouth 2 (two) times daily as needed.   No facility-administered encounter medications on file as of 06/12/2021.    Allergies (verified) Yellow jacket venom and Bee venom   History: Past Medical History:  Diagnosis Date   Elevated prostate specific antigen (PSA)    aged out   GERD (gastroesophageal reflux disease)    Lumbago    Other voice and resonance disorders    damage from reflux    Personal history of colonic polyps    Unspecified tinnitus    resolved   Past Surgical History:  Procedure Laterality Date   CATARACT EXTRACTION Bilateral 11/30/1998   CATARACT EXTRACTION Bilateral 2000   CATARACT EXTRACTION W/ INTRAOCULAR LENS  IMPLANT, BILATERAL     punch biopsy of a lesion on forehead     TONSILLECTOMY     transrectal prostate biopsies that were negative     Family History  Problem Relation Age of Onset   Peripheral vascular disease Mother    Other Mother        bilateral Leg amputation   Coronary artery disease Father    Heart attack Father    Heart disease Father        heart failure/ acute MI   Peripheral vascular disease Sister    Coronary artery disease Brother        valve replaced   Heart disease Brother        MI x2, CABG, AVR   Cancer Neg Hx        colon or prostate   Diabetes Neg Hx    Social History   Socioeconomic History   Marital status: Married    Spouse name: Not on file   Number of children: 2   Years of education: 12   Highest education level: Not on file  Occupational History    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 Married '59. 2 daughters - '68, '71; 2 grandsons, 1 granddaughter, 2 step-grandchildren. Work: retired from [employer], works at [employer] 2 days /week.  End of life: DNR, no prolonged mechanical ventilation, no heroic or futile measures.    Social Determinants of Health   Financial Resource Strain: Low Risk    Difficulty of Paying Living Expenses: Not hard at all  Food Insecurity: No Food Insecurity   Worried About Charity fundraiser in the  Last Year: Never true   [place] in the Last Year: Never true  Transportation Needs: No Transportation Needs   Lack of Transportation (Medical): No   Lack of Transportation (Non-Medical): No  Physical Activity: Sufficiently Active   Days of Exercise per Week: 5 days   Minutes of Exercise per Session: 30 min  Stress: No Stress Concern Present   Feeling of Stress : Not at all  Social Connections: Socially Integrated   Frequency of Communication with Friends and Family: More than three times a week   Frequency of Social Gatherings with Friends and Family: More than three times a week   Attends Religious Services: More than 4 times per year   Active Member of [employer] or Organizations: Yes   Attends Music therapist: More than 4 times per year   Marital Status: Married    Tobacco Counseling Counseling given: Not Answered   Clinical Intake:  Pre-visit preparation completed: Yes  Pain : No/denies pain Pain Score: 0-No pain     BMI - recorded: 23.31 Nutritional Status: BMI of 19-24  Normal Nutritional Risks: None Diabetes: No  How often do you need to have someone help you when you read instructions, pamphlets, or other written materials from your doctor or pharmacy?: 1 - Never What is the last grade level you completed in school?: High School Graduate  Diabetic? no  Interpreter Needed?: No  Information entered by :: Lisette Abu, LPN   Activities of Daily Living In your present state of health, do you have any difficulty performing the following activities: 06/12/2021 03/28/2021  Hearing? Tempie Donning  Vision? N N  Difficulty concentrating or making decisions? N N  Walking or climbing stairs? N N  Dressing or bathing? N N  Doing errands, shopping? N N  Preparing Food and eating ? N -  Using the Toilet? N -  In the past six months, have you accidently leaked urine? N -  Do you have problems with loss of bowel control? N -  Managing your Medications? N -   Managing your Finances? N -  Housekeeping or managing your Housekeeping? N -  Some recent data might be hidden    Patient Care Team: Janith Lima, MD as PCP - General (Internal Medicine) Keene Breath., MD (Ophthalmology) Meredith Mody, MD as Consulting Physician (Family Medicine) Charlton Haws, [facility] as Pharmacist (Pharmacist)  Indicate any recent Medical Services you may have received from other than [facility] providers in the past year (date may be approximate).     Assessment:   This is a routine wellness examination for Dareld.  Hearing/Vision screen Hearing Screening - Comments:: Patient  wears hearing aids. Vision Screening - Comments:: Patient wears reading glasses; diagnosed with glaucoma.  Eye exam done every 6 months by Dr. Barbie Haggis.  Dietary issues and exercise activities discussed: Current Exercise Habits: Home exercise routine, Type of exercise: walking (Patient still works part-time, and does his own yard work), Time (Minutes): 30, Frequency (Times/Week): 5, Weekly Exercise (Minutes/Week): 150, Intensity: Mild, Exercise limited by: orthopedic condition(s)   Goals Addressed               This Visit's Progress     Patient Stated (pt-stated)        My goal is to stay alive and well.      Depression Screen PHQ 2/9 Scores 06/12/2021 06/11/2020 08/17/2019 10/13/2018 08/11/2017 07/15/2017 04/24/2017  PHQ - 2 Score 0 0 0 0 0 0 0    Fall Risk Fall Risk  06/12/2021 06/11/2020 08/17/2019 10/13/2018 08/11/2017  Falls in the past year? 0 0 0 0 No  Number falls in past yr: 0 0 0 0 -  Injury with Fall? 0 0 0 0 -  Risk for fall due to : No Fall Risks No Fall Risks - - -  Risk for fall due to: Comment - - - - -  Follow up Falls evaluation completed Falls evaluation completed Falls evaluation completed Falls evaluation completed -    FALL RISK PREVENTION PERTAINING TO THE HOME:  Any stairs in or around the home? No  If so, are there any without handrails? No   Home free of loose throw rugs in walkways, pet beds, electrical cords, etc? Yes  Adequate lighting in your home to reduce risk of falls? Yes   ASSISTIVE DEVICES UTILIZED TO PREVENT FALLS:  Life alert? No  Use of a cane, walker or w/c? No  Grab bars in the bathroom? No  Shower chair or bench in shower? No  Elevated toilet seat or a handicapped toilet? No   TIMED UP AND GO:  Was the test performed? Yes .  Length of time to ambulate 10 feet: 6 sec.   Gait steady and fast without use of assistive device  Cognitive Function: Normal cognitive status assessed by direct observation by this Nurse Health Advisor. No abnormalities found.   MMSE - Mini Mental State Exam 07/15/2017 03/04/2015  Orientation to time 5 5  Orientation to Place 5 5  Registration 3 3  Attention/ Calculation 5 5  Recall 1 2  Language- name 2 objects 2 2  Language- repeat 1 1  Language- follow 3 step command 3 3  Language- read & follow direction 1 1  Write a sentence 1 1  Copy design 1 1  Total score 28 29        Immunizations Immunization History  Administered Date(s) Administered   Fluad Quad(high Dose 65+) 08/17/2019, 09/11/2020   Influenza, High Dose Seasonal PF 10/13/2018   PFIZER(Purple Top)SARS-COV-2 Vaccination 12/24/2019, 01/12/2020, 05/22/2021   Pneumococcal Conjugate-13 10/08/2014   Pneumococcal Polysaccharide-23 11/30/2002, 10/13/2018   Td 11/30/2002, 09/14/2013   Zoster, Live 02/28/2006    TDAP status: Up to date  Flu Vaccine status: Up to date  Pneumococcal vaccine status: Up to date  Covid-19 vaccine status: Completed vaccines  Qualifies for Shingles Vaccine? Yes   Zostavax completed Yes   Shingrix Completed?: No.    Education has been provided regarding the importance of this vaccine. Patient has been advised to call insurance company to determine out of pocket expense if they have not yet received this vaccine. Advised  may also receive vaccine at local pharmacy or Health Dept.  Verbalized acceptance and understanding.  Screening Tests Health Maintenance  Topic Date Due   Zoster Vaccines- Shingrix (1 of 2) Never done   INFLUENZA VACCINE  06/30/2021   COVID-19 Vaccine (4 - Booster for Pfizer series) 08/22/2021   TETANUS/TDAP  10/04/2023   PNA vac Low Risk Adult  Completed   HPV VACCINES  Aged Out    Health Maintenance  Health Maintenance Due  Topic Date Due   Zoster Vaccines- Shingrix (1 of 2) Never done    Colorectal cancer screening: No longer required.   Lung Cancer Screening: (Low Dose CT Chest recommended if Age 31-80 years, 30 pack-year currently smoking OR have quit w/in 15years.) does not qualify.   Lung Cancer Screening Referral: no  Additional Screening:  Hepatitis C Screening: does not qualify; Completed no  Vision Screening: Recommended annual ophthalmology exams for early detection of glaucoma and other disorders of the eye. Is the patient up to date with their annual eye exam?  Yes  Who is the provider or what is the name of the office in which the patient attends annual eye exams? Dr. Chilton Greathouse If pt is not established with a provider, would they like to be referred to a provider to establish care? No .   Dental Screening: Recommended annual dental exams for proper oral hygiene  Community Resource Referral / Chronic Care Management: CRR required this visit?  No   CCM required this visit?  No      Plan:     I have personally reviewed and noted the following in the patient's chart:   Medical and social history Use of alcohol, tobacco or illicit drugs  Current medications and supplements including opioid prescriptions. Patient is not currently taking opioid prescriptions. Functional ability and status Nutritional status Physical activity Advanced directives List of other physicians Hospitalizations, surgeries, and ER visits in previous 12 months Vitals Screenings to include cognitive, depression, and falls Referrals  and appointments  In addition, I have reviewed and discussed with patient certain preventive protocols, quality metrics, and best practice recommendations. A written personalized care plan for preventive services as well as general preventive health recommendations were provided to patient.     Sheral Flow, LPN   1/49/7026   Nurse Notes: n/a

## 2021-06-13 DIAGNOSIS — H66012 Acute suppurative otitis media with spontaneous rupture of ear drum, left ear: Secondary | ICD-10-CM | POA: Diagnosis not present

## 2021-07-01 DIAGNOSIS — M25562 Pain in left knee: Secondary | ICD-10-CM | POA: Diagnosis not present

## 2021-07-30 DIAGNOSIS — H608X2 Other otitis externa, left ear: Secondary | ICD-10-CM | POA: Diagnosis not present

## 2021-08-29 DIAGNOSIS — H6982 Other specified disorders of Eustachian tube, left ear: Secondary | ICD-10-CM | POA: Diagnosis not present

## 2021-08-29 DIAGNOSIS — H9072 Mixed conductive and sensorineural hearing loss, unilateral, left ear, with unrestricted hearing on the contralateral side: Secondary | ICD-10-CM | POA: Diagnosis not present

## 2021-08-29 DIAGNOSIS — H6123 Impacted cerumen, bilateral: Secondary | ICD-10-CM | POA: Diagnosis not present

## 2021-09-28 ENCOUNTER — Encounter: Payer: Self-pay | Admitting: Internal Medicine

## 2021-10-02 ENCOUNTER — Other Ambulatory Visit: Payer: Self-pay

## 2021-10-02 ENCOUNTER — Ambulatory Visit (INDEPENDENT_AMBULATORY_CARE_PROVIDER_SITE_OTHER): Payer: Medicare Other | Admitting: Internal Medicine

## 2021-10-02 ENCOUNTER — Encounter: Payer: Self-pay | Admitting: Internal Medicine

## 2021-10-02 VITALS — BP 116/70 | HR 72 | Temp 98.6°F | Ht 66.0 in | Wt 146.2 lb

## 2021-10-02 DIAGNOSIS — Z0000 Encounter for general adult medical examination without abnormal findings: Secondary | ICD-10-CM | POA: Diagnosis not present

## 2021-10-02 DIAGNOSIS — Z23 Encounter for immunization: Secondary | ICD-10-CM

## 2021-10-02 DIAGNOSIS — E785 Hyperlipidemia, unspecified: Secondary | ICD-10-CM | POA: Diagnosis not present

## 2021-10-02 DIAGNOSIS — Z0001 Encounter for general adult medical examination with abnormal findings: Secondary | ICD-10-CM

## 2021-10-02 LAB — HEPATIC FUNCTION PANEL
ALT: 10 U/L (ref 0–53)
AST: 15 U/L (ref 0–37)
Albumin: 4 g/dL (ref 3.5–5.2)
Alkaline Phosphatase: 73 U/L (ref 39–117)
Bilirubin, Direct: 0.2 mg/dL (ref 0.0–0.3)
Total Bilirubin: 1.1 mg/dL (ref 0.2–1.2)
Total Protein: 6.6 g/dL (ref 6.0–8.3)

## 2021-10-02 LAB — CBC WITH DIFFERENTIAL/PLATELET
Basophils Absolute: 0 10*3/uL (ref 0.0–0.1)
Basophils Relative: 0.2 % (ref 0.0–3.0)
Eosinophils Absolute: 0.1 10*3/uL (ref 0.0–0.7)
Eosinophils Relative: 0.6 % (ref 0.0–5.0)
HCT: 44.9 % (ref 39.0–52.0)
Hemoglobin: 15 g/dL (ref 13.0–17.0)
Lymphocytes Relative: 14.1 % (ref 12.0–46.0)
Lymphs Abs: 1.3 10*3/uL (ref 0.7–4.0)
MCHC: 33.5 g/dL (ref 30.0–36.0)
MCV: 93.6 fl (ref 78.0–100.0)
Monocytes Absolute: 0.8 10*3/uL (ref 0.1–1.0)
Monocytes Relative: 8.6 % (ref 3.0–12.0)
Neutro Abs: 7 10*3/uL (ref 1.4–7.7)
Neutrophils Relative %: 76.5 % (ref 43.0–77.0)
Platelets: 169 10*3/uL (ref 150.0–400.0)
RBC: 4.79 Mil/uL (ref 4.22–5.81)
RDW: 15 % (ref 11.5–15.5)
WBC: 9.1 10*3/uL (ref 4.0–10.5)

## 2021-10-02 LAB — LIPID PANEL
Cholesterol: 192 mg/dL (ref 0–200)
HDL: 87.6 mg/dL (ref >39.00)
LDL Cholesterol: 96 mg/dL (ref 0–99)
NonHDL: 104.8
Total CHOL/HDL Ratio: 2
Triglycerides: 45 mg/dL (ref 0.0–149.0)
VLDL: 9 mg/dL (ref 0.0–40.0)

## 2021-10-02 LAB — BASIC METABOLIC PANEL
BUN: 17 mg/dL (ref 6–23)
CO2: 27 mEq/L (ref 19–32)
Calcium: 8.9 mg/dL (ref 8.4–10.5)
Chloride: 107 mEq/L (ref 96–112)
Creatinine, Ser: 1.26 mg/dL (ref 0.40–1.50)
GFR: 50.76 mL/min — ABNORMAL LOW (ref >60.00)
Glucose, Bld: 92 mg/dL (ref 70–99)
Potassium: 4 mEq/L (ref 3.5–5.1)
Sodium: 141 mEq/L (ref 135–145)

## 2021-10-02 LAB — TSH: TSH: 3.82 u[IU]/mL (ref 0.35–5.50)

## 2021-10-02 NOTE — Progress Notes (Signed)
Subjective:  Patient ID: Nicholas Frye, male    DOB: [birth year]  Age: 87 y.o. MRN: [id number]  CC: Annual Exam and Hyperlipidemia  This visit occurred during the SARS-CoV-2 public health emergency.  Safety protocols were in place, including screening questions prior to the visit, additional usage of staff PPE, and extensive cleaning of exam room while observing appropriate contact time as indicated for disinfecting solutions.   HPI Nicholas Frye presents for a CPX and f/up -  He offers no new complaints today.  Outpatient Medications Prior to Visit  Medication Sig Dispense Refill   EPINEPHrine (EPIPEN) 0.3 mg/0.3 mL IJ SOAJ injection Inject 0.3 mLs (0.3 mg total) into the muscle once. 1 Device 3   Multiple Vitamins-Minerals (PRESERVISION AREDS PO) Take by mouth.      No facility-administered medications prior to visit.    ROS Review of Systems  Constitutional:  Negative for appetite change, diaphoresis, fatigue and unexpected weight change.  HENT: Negative.    Eyes: Negative.   Respiratory:  Negative for chest tightness, shortness of breath and wheezing.   Cardiovascular:  Negative for chest pain, palpitations and leg swelling.  Gastrointestinal:  Negative for abdominal pain, constipation, diarrhea, nausea and vomiting.  Endocrine: Negative.   Genitourinary: Negative.  Negative for difficulty urinating and hematuria.  Musculoskeletal: Negative.  Negative for myalgias.  Skin: Negative.   Neurological:  Negative for dizziness, weakness, light-headedness and numbness.  Hematological:  Negative for adenopathy. Does not bruise/bleed easily.   Objective:  BP 116/70 (BP Location: Left Arm, Patient Position: Sitting, Cuff Size: Large)   Pulse 72   Temp 98.6 F (37 C) (Oral)   Ht 5\' 6"  (1.676 m)   Wt 146 lb 3.2 oz (66.3 kg)   SpO2 97%   BMI 23.60 kg/m   BP Readings from Last 3 Encounters:  10/02/21 116/70  06/12/21 118/80  05/13/21 118/70    Wt Readings from  Last 3 Encounters:  10/02/21 146 lb 3.2 oz (66.3 kg)  06/12/21 144 lb 6.4 oz (65.5 kg)  05/13/21 145 lb (65.8 kg)    Physical Exam Vitals reviewed.  Constitutional:      Appearance: Normal appearance.  HENT:     Right Ear: Tympanic membrane and ear canal normal. Decreased hearing noted. There is no impacted cerumen.     Left Ear: Tympanic membrane and ear canal normal. Decreased hearing noted. There is no impacted cerumen.     Nose: Nose normal.     Mouth/Throat:     Mouth: Mucous membranes are moist.  Eyes:     General: No scleral icterus.    Conjunctiva/sclera: Conjunctivae normal.  Cardiovascular:     Rate and Rhythm: Normal rate and regular rhythm.     Heart sounds: No murmur heard. Pulmonary:     Effort: Pulmonary effort is normal.     Breath sounds: No stridor. No wheezing, rhonchi or rales.  Abdominal:     General: Abdomen is flat.     Palpations: There is no mass.     Tenderness: There is no abdominal tenderness. There is no guarding.     Hernia: No hernia is present.  Musculoskeletal:        General: Normal range of motion.     Cervical back: Neck supple.     Right lower leg: No edema.     Left lower leg: No edema.  Lymphadenopathy:     Cervical: No cervical adenopathy.  Skin:    General: Skin is warm  and dry.  Neurological:     General: No focal deficit present.     Mental Status: He is alert.  Psychiatric:        Mood and Affect: Mood normal.        Behavior: Behavior normal.    Lab Results  Component Value Date   WBC 9.1 10/02/2021   HGB 15.0 10/02/2021   HCT 44.9 10/02/2021   PLT 169.0 10/02/2021   GLUCOSE 92 10/02/2021   CHOL 192 10/02/2021   TRIG 45.0 10/02/2021   HDL 87.60 10/02/2021   LDLDIRECT 101.3 04/03/2011   LDLCALC 96 10/02/2021   ALT 10 10/02/2021   AST 15 10/02/2021   NA 141 10/02/2021   K 4.0 10/02/2021   CL 107 10/02/2021   CREATININE 1.26 10/02/2021   BUN 17 10/02/2021   CO2 27 10/02/2021   TSH 3.82 10/02/2021   PSA 2.03  04/03/2011   HGBA1C 5.3 08/17/2019    DG Finger Index Right  Result Date: 10/13/2018 CLINICAL DATA:  Fall.  Index finger injury. EXAM: RIGHT INDEX FINGER 2+V COMPARISON:  No prior. FINDINGS: Diffuse soft tissue swelling. Tiny bony density noted posterior to the distal aspect of the proximal phalanx of the right second digit. This could represent tiny fracture chip. No other focal abnormality identified. IMPRESSION: Diffuse soft tissue swelling. Tiny bony density noted along the posterior aspect of the distal phalanx of the right second digit. This could represent a tiny fracture chip. Electronically Signed   By: Marcello Moores  Register   On: 10/13/2018 12:50    Assessment & Plan:   Uthman was seen today for annual exam and hyperlipidemia.  Diagnoses and all orders for this visit:  Hyperlipidemia with target LDL less than 100- Statin tx is not indicated. His labs are normal. -     CBC with Differential/Platelet; Future -     Basic metabolic panel; Future -     Lipid panel; Future -     TSH; Future -     Hepatic function panel; Future -     Hepatic function panel -     TSH -     Lipid panel -     Basic metabolic panel -     CBC with Differential/Platelet  Encounter for general adult medical examination with abnormal findings- Exam completed, labs reviewed, vaccines reviewed and updated, no cancer screenings indicated, pt ed was given.  Need for immunization against influenza -     Flu Vaccine QUAD High Dose(Fluad)  I am having Nicholas Lav "Ben" maintain his EPINEPHrine and Multiple Vitamins-Minerals (PRESERVISION AREDS PO).  No orders of the defined types were placed in this encounter.    Follow-up: No follow-ups on file.  [name], MD

## 2021-11-10 ENCOUNTER — Encounter: Payer: Self-pay | Admitting: Internal Medicine

## 2021-11-11 DIAGNOSIS — H524 Presbyopia: Secondary | ICD-10-CM | POA: Diagnosis not present

## 2021-11-11 DIAGNOSIS — H52223 Regular astigmatism, bilateral: Secondary | ICD-10-CM | POA: Diagnosis not present

## 2021-11-11 DIAGNOSIS — H353132 Nonexudative age-related macular degeneration, bilateral, intermediate dry stage: Secondary | ICD-10-CM | POA: Diagnosis not present

## 2021-11-11 DIAGNOSIS — H35372 Puckering of macula, left eye: Secondary | ICD-10-CM | POA: Diagnosis not present

## 2021-11-14 DIAGNOSIS — H6982 Other specified disorders of Eustachian tube, left ear: Secondary | ICD-10-CM | POA: Diagnosis not present

## 2021-11-14 DIAGNOSIS — H9202 Otalgia, left ear: Secondary | ICD-10-CM | POA: Diagnosis not present

## 2021-11-14 DIAGNOSIS — H903 Sensorineural hearing loss, bilateral: Secondary | ICD-10-CM | POA: Diagnosis not present

## 2021-11-14 DIAGNOSIS — J310 Chronic rhinitis: Secondary | ICD-10-CM | POA: Diagnosis not present

## 2021-11-14 DIAGNOSIS — J343 Hypertrophy of nasal turbinates: Secondary | ICD-10-CM | POA: Diagnosis not present

## 2021-12-02 DIAGNOSIS — M5136 Other intervertebral disc degeneration, lumbar region: Secondary | ICD-10-CM | POA: Diagnosis not present

## 2022-01-06 DIAGNOSIS — H905 Unspecified sensorineural hearing loss: Secondary | ICD-10-CM | POA: Diagnosis not present

## 2022-01-21 DIAGNOSIS — H66012 Acute suppurative otitis media with spontaneous rupture of ear drum, left ear: Secondary | ICD-10-CM | POA: Diagnosis not present

## 2022-02-12 DIAGNOSIS — H66012 Acute suppurative otitis media with spontaneous rupture of ear drum, left ear: Secondary | ICD-10-CM | POA: Diagnosis not present

## 2022-04-28 DIAGNOSIS — M25551 Pain in right hip: Secondary | ICD-10-CM | POA: Diagnosis not present

## 2022-05-12 DIAGNOSIS — H353132 Nonexudative age-related macular degeneration, bilateral, intermediate dry stage: Secondary | ICD-10-CM | POA: Diagnosis not present

## 2022-05-12 DIAGNOSIS — H35372 Puckering of macula, left eye: Secondary | ICD-10-CM | POA: Diagnosis not present

## 2022-05-19 DIAGNOSIS — H66012 Acute suppurative otitis media with spontaneous rupture of ear drum, left ear: Secondary | ICD-10-CM | POA: Diagnosis not present

## 2022-05-19 DIAGNOSIS — H6122 Impacted cerumen, left ear: Secondary | ICD-10-CM | POA: Diagnosis not present

## 2022-06-05 ENCOUNTER — Telehealth: Payer: Self-pay | Admitting: Internal Medicine

## 2022-06-05 NOTE — Telephone Encounter (Signed)
LVM for pt to rtn my call to schedule AWV with [facility] call back # [phone] 

## 2022-06-26 ENCOUNTER — Ambulatory Visit: Payer: Medicare Other

## 2022-07-03 ENCOUNTER — Ambulatory Visit (INDEPENDENT_AMBULATORY_CARE_PROVIDER_SITE_OTHER): Payer: Medicare Other

## 2022-07-03 DIAGNOSIS — Z0000 Encounter for general adult medical examination without abnormal findings: Secondary | ICD-10-CM

## 2022-07-03 NOTE — Patient Instructions (Signed)
It was a pleasure speaking with you today   Please follow up in 1 year  

## 2022-07-03 NOTE — Progress Notes (Signed)
Subjective:   Nicholas Frye is a 88 y.o. male who presents for Medicare Annual/Subsequent preventive examination.  Review of Systems           Objective:    There were no vitals filed for this visit. There is no height or weight on file to calculate BMI.     06/12/2021    8:47 AM 08/11/2017    2:09 PM 07/15/2017    1:23 PM 03/28/2016   11:21 AM 03/04/2015    1:40 PM 03/04/2015   12:58 PM  Advanced Directives  Does Patient Have a Medical Advance Directive? Yes Yes Yes Yes Yes Yes  Type of Advance Directive Living will;Healthcare Power of [place];Living will Living will;Healthcare Power of [place];Living will Healthcare Power of [place]  Does patient want to make changes to medical advance directive? No - Patient declined   No - Patient declined No - Patient declined Yes - information given  Copy of [place] in Chart? No - copy requested Yes No - copy requested Yes Yes Yes    Current Medications (verified) Outpatient Encounter Medications as of 07/03/2022  Medication Sig   EPINEPHrine (EPIPEN) 0.3 mg/0.3 mL IJ SOAJ injection Inject 0.3 mLs (0.3 mg total) into the muscle once.   Multiple Vitamins-Minerals (PRESERVISION AREDS PO) Take by mouth.    No facility-administered encounter medications on file as of 07/03/2022.    Allergies (verified) Yellow jacket venom and Bee venom   History: Past Medical History:  Diagnosis Date   Elevated prostate specific antigen (PSA)    aged out   GERD (gastroesophageal reflux disease)    Lumbago    Other voice and resonance disorders    damage from reflux   Personal history of colonic polyps    Unspecified tinnitus    resolved   Past Surgical History:  Procedure Laterality Date   CATARACT EXTRACTION Bilateral 11/30/1998   CATARACT EXTRACTION Bilateral 2000   CATARACT EXTRACTION W/ INTRAOCULAR LENS  IMPLANT, BILATERAL      punch biopsy of a lesion on forehead     TONSILLECTOMY     transrectal prostate biopsies that were negative     Family History  Problem Relation Age of Onset   Peripheral vascular disease Mother    Other Mother        bilateral Leg amputation   Coronary artery disease Father    Heart attack Father    Heart disease Father        heart failure/ acute MI   Peripheral vascular disease Sister    Coronary artery disease Brother        valve replaced   Heart disease Brother        MI x2, CABG, AVR   Cancer Neg Hx        colon or prostate   Diabetes Neg Hx    Social History   Socioeconomic History   Marital status: Married    Spouse name: Not on file   Number of children: 2   Years of education: 12   Highest education level: Not on file  Occupational History    Comment: retired-still works at [employer]  Tobacco Use   Smoking status: Never   Smokeless tobacco: Never  Substance and Sexual Activity   Alcohol use: Yes    Alcohol/week: 2.0 standard drinks of alcohol    Types: 2 Standard drinks or equivalent per week  Comment: cocktail before dinner   Drug use: No   Sexual activity: Never  Other Topics Concern   Not on file  Social History Narrative   HSG. Married '59. 2 daughters - '68, '71; 2 grandsons, 1 granddaughter, 2 step-grandchildren. Work: retired from [employer], works at [employer] 2 days /week.  End of life: DNR, no prolonged mechanical ventilation, no heroic or futile measures.    Social Determinants of Health   Financial Resource Strain: Low Risk  (06/12/2021)   Overall Financial Resource Strain (CARDIA)    Difficulty of Paying Living Expenses: Not hard at all  Food Insecurity: No Food Insecurity (06/12/2021)   Hunger Vital Sign    Worried About Running Out of Food in the Last Year: Never true    Ran Out of Food in the Last Year: Never true  Transportation Needs: No Transportation Needs (06/12/2021)   PRAPARE - Radiographer, therapeutic (Medical): No    Lack of Transportation (Non-Medical): No  Physical Activity: Sufficiently Active (06/12/2021)   Exercise Vital Sign    Days of Exercise per Week: 5 days    Minutes of Exercise per Session: 30 min  Stress: No Stress Concern Present (06/12/2021)   [place]    Feeling of Stress : Not at all  Social Connections: [place] (06/12/2021)   Social Connection and Isolation Panel [NHANES]    Frequency of Communication with Friends and Family: More than three times a week    Frequency of Social Gatherings with Friends and Family: More than three times a week    Attends Religious Services: More than 4 times per year    Active Member of [employer] or Organizations: Yes    Attends Music therapist: More than 4 times per year    Marital Status: Married    Tobacco Counseling Counseling given: Not Answered   Clinical Intake:                 Diabetic?No         Activities of Daily Living     No data to display           Patient Care Team: Janith Lima, MD as PCP - General (Internal Medicine) Keene Breath., MD (Ophthalmology) Meredith Mody, MD as Consulting Physician (Family Medicine) Charlton Haws, [facility] as Pharmacist (Pharmacist)  Indicate any recent Medical Services you may have received from other than [facility] providers in the past year (date may be approximate).     Assessment:   This is a routine wellness examination for Nicholas Frye.  Hearing/Vision screen No results found.  Dietary issues and exercise activities discussed:     Goals Addressed   None   Depression Screen    10/02/2021    8:40 AM 06/12/2021    8:46 AM 06/11/2020    2:52 PM 08/17/2019   11:15 AM 10/13/2018    9:00 AM 08/11/2017    2:10 PM 07/15/2017    1:23 PM  PHQ 2/9 Scores  PHQ - 2 Score 0 0 0 0 0 0 0    Fall Risk    10/02/2021    8:40 AM 06/12/2021    8:48 AM  06/11/2020    2:52 PM 08/17/2019   11:14 AM 10/13/2018    9:00 AM  Fall Risk   Falls in the past year? 0 0 0 0 0  Number falls in past yr: 0 0 0  0 0  Injury with Fall? 0 0 0 0 0  Risk for fall due to :  No Fall Risks No Fall Risks    Follow up  Falls evaluation completed Falls evaluation completed Falls evaluation completed Falls evaluation completed    [place]:  Any stairs in or around the home? Yes  If so, are there any without handrails? No  Home free of loose throw rugs in walkways, pet beds, electrical cords, etc? Yes  Adequate lighting in your home to reduce risk of falls? Yes   ASSISTIVE DEVICES UTILIZED TO PREVENT FALLS:  Life alert? No  Use of a cane, walker or w/c? No  Grab bars in the bathroom? No  Shower chair or bench in shower? No  Elevated toilet seat or a handicapped toilet? No   TIMED UP AND GO:  Was the test performed? No .  Length of time to ambulate 10 feet:  sec.     Cognitive Function:    07/15/2017    1:20 PM 03/04/2015    1:06 PM  MMSE - Mini Mental State Exam  Orientation to time 5 5  Orientation to Place 5 5  Registration 3 3  Attention/ Calculation 5 5  Recall 1 2  Language- name 2 objects 2 2  Language- repeat 1 1  Language- follow 3 step command 3 3  Language- read & follow direction 1 1  Write a sentence 1 1  Copy design 1 1  Total score 28 29        Immunizations Immunization History  Administered Date(s) Administered   Fluad Quad(high Dose 65+) 08/17/2019, 09/11/2020, 09/10/2021, 10/02/2021   Influenza, High Dose Seasonal PF 10/08/2014, 11/02/2016, 10/13/2018   Influenza-Unspecified 09/30/2001, 10/14/2015, 11/04/2017, 08/15/2019, 09/11/2020, 08/30/2021   PFIZER Comirnaty(Gray Top)Covid-19 Tri-Sucrose Vaccine 05/22/2021   PFIZER(Purple Top)SARS-COV-2 Vaccination 12/24/2019, 01/12/2020, 09/16/2021   Pfizer Covid-19 Vaccine Bivalent Booster 7yr & up 09/07/2021   Pneumococcal Conjugate-13  10/08/2014   Pneumococcal Polysaccharide-23 11/30/2002, 10/13/2018, 11/10/2021   Pneumococcal-Unspecified 03/16/2002   Td 11/30/2002, 09/14/2013   Tdap 10/03/2013   Zoster Recombinat (Shingrix) 09/10/2021, 11/10/2021, 02/09/2022   Zoster, Live 02/28/2006    TDAP status: Up to date  Flu Vaccine status: Up to date  Pneumococcal vaccine status: Up to date  Covid-19 vaccine status: Completed vaccines  Qualifies for Shingles Vaccine? Yes   Zostavax completed Yes   Shingrix Completed?: Yes  Screening Tests Health Maintenance  Topic Date Due   INFLUENZA VACCINE  06/30/2022   TETANUS/TDAP  10/04/2023   Pneumonia Vaccine 88 Years old  Completed   COVID-19 Vaccine  Completed   Zoster Vaccines- Shingrix  Completed   HPV VACCINES  Aged Out    Health Maintenance  Health Maintenance Due  Topic Date Due   INFLUENZA VACCINE  06/30/2022    Colorectal cancer screening: No longer required.   Lung Cancer Screening: (Low Dose CT Chest recommended if Age 86-80years, 30 pack-year currently smoking OR have quit w/in 15years.) does not qualify.   Lung Cancer Screening Referral: Never a smoker  Additional Screening:  Hepatitis C Screening: does not qualify; Completed   Vision Screening: Recommended annual ophthalmology exams for early detection of glaucoma and other disorders of the eye. Is the patient up to date with their annual eye exam?  Yes  Who is the provider or what is the name of the office in which the patient attends annual eye exams? Dr. PRomilda GarretIf pt is not established with  a provider, would they like to be referred to a provider to establish care? Yes .   Dental Screening: Recommended annual dental exams for proper oral hygiene  Community Resource Referral / Chronic Care Management: CRR required this visit?  No   CCM required this visit?  No      Plan:     I have personally reviewed and noted the following in the patient's chart:   Medical and social  history Use of alcohol, tobacco or illicit drugs  Current medications and supplements including opioid prescriptions. Patient is not currently taking opioid prescriptions. Functional ability and status Nutritional status Physical activity Advanced directives List of other physicians Hospitalizations, surgeries, and ER visits in previous 12 months Vitals Screenings to include cognitive, depression, and falls Referrals and appointments  In addition, I have reviewed and discussed with patient certain preventive protocols, quality metrics, and best practice recommendations. A written personalized care plan for preventive services as well as general preventive health recommendations were provided to patient.     Thomes Cake, [place]   07/03/2022   Nurse Notes: No concerns. Patient was advised that if anything changes to give our office a call.

## 2022-07-06 NOTE — Progress Notes (Signed)
I connected with  Nicholas Frye on 07/06/22 by a video enabled telemedicine application and verified that I am speaking with the correct person using two identifiers.   I discussed the limitations of evaluation and management by telemedicine. The patient expressed understanding and agreed to proceed.

## 2022-07-13 NOTE — Progress Notes (Signed)
Subjective:   Nicholas Frye is a 88 y.o. male who presents for Medicare Annual/Subsequent preventive examination.  I connected with  Marlyne Beards on 07/03/2022 by a audio enabled telemedicine application and verified that I am speaking with the correct person using two identifiers.  Patient Location: Home  Provider Location: Office/Clinic  I discussed the limitations of evaluation and management by telemedicine. The patient expressed understanding and agreed to proceed.   Review of Systems: Defer to PCP       Objective:    There were no vitals filed for this visit. There is no height or weight on file to calculate BMI.     07/13/2022    3:40 PM 06/12/2021    8:47 AM 08/11/2017    2:09 PM 07/15/2017    1:23 PM 03/28/2016   11:21 AM 03/04/2015    1:40 PM 03/04/2015   12:58 PM  Advanced Directives  Does Patient Have a Medical Advance Directive? Yes Yes Yes Yes Yes Yes Yes  Type of Paramedic of [place];Living will Living will;Healthcare Power of [place];Living will Living will;Healthcare Power of [place];Living will Healthcare Power of [place]  Does patient want to make changes to medical advance directive? No - Patient declined No - Patient declined   No - Patient declined No - Patient declined Yes - information given  Copy of [place] in Chart? No - copy requested No - copy requested Yes No - copy requested Yes Yes Yes    Current Medications (verified) Outpatient Encounter Medications as of 07/03/2022  Medication Sig   EPINEPHrine (EPIPEN) 0.3 mg/0.3 mL IJ SOAJ injection Inject 0.3 mLs (0.3 mg total) into the muscle once.   Multiple Vitamins-Minerals (PRESERVISION AREDS PO) Take by mouth.    No facility-administered encounter medications on file as of 07/03/2022.    Allergies (verified) Yellow jacket venom and Bee venom    History: Past Medical History:  Diagnosis Date   Elevated prostate specific antigen (PSA)    aged out   GERD (gastroesophageal reflux disease)    Lumbago    Other voice and resonance disorders    damage from reflux   Personal history of colonic polyps    Unspecified tinnitus    resolved   Past Surgical History:  Procedure Laterality Date   CATARACT EXTRACTION Bilateral 11/30/1998   CATARACT EXTRACTION Bilateral 2000   CATARACT EXTRACTION W/ INTRAOCULAR LENS  IMPLANT, BILATERAL     punch biopsy of a lesion on forehead     TONSILLECTOMY     transrectal prostate biopsies that were negative     Family History  Problem Relation Age of Onset   Peripheral vascular disease Mother    Other Mother        bilateral Leg amputation   Coronary artery disease Father    Heart attack Father    Heart disease Father        heart failure/ acute MI   Peripheral vascular disease Sister    Coronary artery disease Brother        valve replaced   Heart disease Brother        MI x2, CABG, AVR   Cancer Neg Hx        colon or prostate   Diabetes Neg Hx    Social History   Socioeconomic History   Marital status: Married    Spouse name: Not on file  Number of children: 2   Years of education: 12   Highest education level: Not on file  Occupational History    Comment: retired-still works at [employer] Use   Smoking status: Never   Smokeless tobacco: Never  Substance and Sexual Activity   Alcohol use: Yes    Alcohol/week: 2.0 standard drinks of alcohol    Types: 2 Standard drinks or equivalent per week    Comment: cocktail before dinner   Drug use: No   Sexual activity: Never  Other Topics Concern   Not on file  Social History Narrative   HSG. Married '59. 2 daughters - '68, '71; 2 grandsons, 1 granddaughter, 2 step-grandchildren. Work: retired from [employer], works at [employer] 2 days /week.  End of life: DNR, no prolonged mechanical ventilation, no  heroic or futile measures.    Social Determinants of Health   Financial Resource Strain: Low Risk  (07/03/2022)   Overall Financial Resource Strain (CARDIA)    Difficulty of Paying Living Expenses: Not hard at all  Food Insecurity: No Food Insecurity (07/03/2022)   Hunger Vital Sign    Worried About Running Out of Food in the Last Year: Never true    Ran Out of Food in the Last Year: Never true  Transportation Needs: No Transportation Needs (07/03/2022)   PRAPARE - Hydrologist (Medical): No    Lack of Transportation (Non-Medical): No  Physical Activity: Insufficiently Active (07/03/2022)   Exercise Vital Sign    Days of Exercise per Week: 3 days    Minutes of Exercise per Session: 10 min  Stress: No Stress Concern Present (07/03/2022)   [place]    Feeling of Stress : Not at all  Social Connections: [place] (07/03/2022)   Social Connection and Isolation Panel [NHANES]    Frequency of Communication with Friends and Family: More than three times a week    Frequency of Social Gatherings with Friends and Family: Three times a week    Attends Religious Services: More than 4 times per year    Active Member of Clubs or Organizations: Yes    Attends Music therapist: More than 4 times per year    Marital Status: Married    Tobacco Counseling Counseling given: Not Answered   Clinical Intake:  Pre-visit preparation completed: Yes  Pain : No/denies pain     Nutritional Status: BMI of 19-24  Normal Nutritional Risks: None Diabetes: No  How often do you need to have someone help you when you read instructions, pamphlets, or other written materials from your doctor or pharmacy?: 1 - Never What is the last grade level you completed in school?: 12  Diabetic?No  Interpreter Needed?: No      Activities of Daily Living    07/03/2022    9:01 AM  In your present state  of health, do you have any difficulty performing the following activities:  Hearing? 0  Vision? 0  Difficulty concentrating or making decisions? 0  Walking or climbing stairs? 0  Dressing or bathing? 0  Doing errands, shopping? 0    Patient Care Team: Janith Lima, MD as PCP - General (Internal Medicine) Keene Breath., MD (Ophthalmology) Meredith Mody, MD as Consulting Physician (Family Medicine) Charlton Haws, [facility] as Pharmacist (Pharmacist)  Indicate any recent Medical Services you may have received from other than [facility] providers in the past year (date may  be approximate).     Assessment:   This is a routine wellness examination for [place].  Hearing/Vision screen No results found.  Dietary issues and exercise activities discussed:    Depression Screen    07/03/2022    8:58 AM 10/02/2021    8:40 AM 06/12/2021    8:46 AM 06/11/2020    2:52 PM 08/17/2019   11:15 AM 10/13/2018    9:00 AM 08/11/2017    2:10 PM  PHQ 2/9 Scores  PHQ - 2 Score 0 0 0 0 0 0 0  PHQ- 9 Score 0          Fall Risk    07/13/2022    3:42 PM 10/02/2021    8:40 AM 06/12/2021    8:48 AM 06/11/2020    2:52 PM 08/17/2019   11:14 AM  Fall Risk   Falls in the past year? 0 0 0 0 0  Number falls in past yr: 0 0 0 0 0  Injury with Fall? 0 0 0 0 0  Risk for fall due to : No Fall Risks  No Fall Risks No Fall Risks   Follow up Falls evaluation completed;Falls prevention discussed  Falls evaluation completed Falls evaluation completed Falls evaluation completed    FALL RISK PREVENTION PERTAINING TO THE HOME:  Any stairs in or around the home? Yes  If so, are there any without handrails? No  Home free of loose throw rugs in walkways, pet beds, electrical cords, etc? Yes  Adequate lighting in your home to reduce risk of falls? Yes   ASSISTIVE DEVICES UTILIZED TO PREVENT FALLS:  Life alert? No  Use of a cane, walker or w/c? No  Grab bars in the bathroom? No  Shower chair or bench in  shower? No  Elevated toilet seat or a handicapped toilet? No   TIMED UP AND GO:  Was the test performed? No .  Length of time to ambulate 10 feet:  sec.     Cognitive Function:      07/03/2022    9:01 AM  6CIT Screen  What Year? 0 points  What month? 0 points  What time? 0 points  Count back from 20 0 points  Months in reverse 0 points  Repeat phrase 0 points  Total Score 0 points   Immunizations Immunization History  Administered Date(s) Administered   Fluad Quad(high Dose 65+) 08/17/2019, 09/11/2020, 09/10/2021, 10/02/2021   Influenza, High Dose Seasonal PF 10/08/2014, 11/02/2016, 10/13/2018   Influenza-Unspecified 09/30/2001, 10/14/2015, 11/04/2017, 08/15/2019, 09/11/2020, 08/30/2021   PFIZER Comirnaty(Gray Top)Covid-19 Tri-Sucrose Vaccine 05/22/2021   PFIZER(Purple Top)SARS-COV-2 Vaccination 12/24/2019, 01/12/2020, 09/16/2021   Pfizer Covid-19 Vaccine Bivalent Booster 58yr & up 09/07/2021   Pneumococcal Conjugate-13 10/08/2014   Pneumococcal Polysaccharide-23 11/30/2002, 10/13/2018, 11/10/2021   Pneumococcal-Unspecified 03/16/2002   Td 11/30/2002, 09/14/2013   Tdap 10/03/2013   Zoster Recombinat (Shingrix) 09/10/2021, 11/10/2021, 02/09/2022   Zoster, Live 02/28/2006    TDAP status: Up to date  Flu Vaccine status: Up to date  Pneumococcal vaccine status: Up to date  Covid-19 vaccine status: Completed vaccines  Qualifies for Shingles Vaccine? Yes   Zostavax completed Yes   Shingrix Completed?: Yes  Screening Tests Health Maintenance  Topic Date Due   COVID-19 Vaccine (5 - Pfizer risk series) 11/11/2021   INFLUENZA VACCINE  06/30/2022   TETANUS/TDAP  10/04/2023   Pneumonia Vaccine 88 Years old  Completed   Zoster Vaccines- Shingrix  Completed   HPV VACCINES  Aged Out    Health  Maintenance  Health Maintenance Due  Topic Date Due   COVID-19 Vaccine (5 - Pfizer risk series) 11/11/2021   INFLUENZA VACCINE  06/30/2022    Colorectal cancer  screening: No longer required.   Lung Cancer Screening: (Low Dose CT Chest recommended if Age 21-80 years, 30 pack-year currently smoking OR have quit w/in 15years.) does not qualify.   Lung Cancer Screening Referral: Never a smoker  Additional Screening:  Hepatitis C Screening: does not qualify; Completed   Vision Screening: Recommended annual ophthalmology exams for early detection of glaucoma and other disorders of the eye. Is the patient up to date with their annual eye exam?  Yes  Who is the provider or what is the name of the office in which the patient attends annual eye exams? Dr. Romilda Garret If pt is not established with a provider, would they like to be referred to a provider to establish care? Yes .   Dental Screening: Recommended annual dental exams for proper oral hygiene  Community Resource Referral / Chronic Care Management: CRR required this visit?  No   CCM required this visit?  No      Plan:     I have personally reviewed and noted the following in the patient's chart:   Medical and social history Use of alcohol, tobacco or illicit drugs  Current medications and supplements including opioid prescriptions. Patient is not currently taking opioid prescriptions. Functional ability and status Nutritional status Physical activity Advanced directives List of other physicians Hospitalizations, surgeries, and ER visits in previous 12 months Vitals Screenings to include cognitive, depression, and falls Referrals and appointments  In addition, I have reviewed and discussed with patient certain preventive protocols, quality metrics, and best practice recommendations. A written personalized care plan for preventive services as well as general preventive health recommendations were provided to patient.    Maryelizabeth Kaufmann, [place]   07/03/2022 Henrene Dodge, RN   07/13/2022 Amended   Nurse Notes: No concerns. Patient was advised that if anything changes to give our office a  call.

## 2022-07-14 ENCOUNTER — Ambulatory Visit: Payer: Medicare Other | Admitting: Internal Medicine

## 2022-07-16 NOTE — Progress Notes (Signed)
Encounter changed to erroneous encounter due to improper documentation. Will reschedule AWV.

## 2022-08-18 DIAGNOSIS — H6122 Impacted cerumen, left ear: Secondary | ICD-10-CM | POA: Diagnosis not present

## 2022-08-18 DIAGNOSIS — H60332 Swimmer's ear, left ear: Secondary | ICD-10-CM | POA: Diagnosis not present

## 2022-09-01 ENCOUNTER — Encounter: Payer: Self-pay | Admitting: Internal Medicine

## 2022-09-01 ENCOUNTER — Ambulatory Visit (INDEPENDENT_AMBULATORY_CARE_PROVIDER_SITE_OTHER): Payer: Medicare Other | Admitting: Internal Medicine

## 2022-09-01 VITALS — BP 132/84 | HR 66 | Temp 98.3°F | Ht 66.0 in | Wt 145.0 lb

## 2022-09-01 DIAGNOSIS — K219 Gastro-esophageal reflux disease without esophagitis: Secondary | ICD-10-CM

## 2022-09-01 DIAGNOSIS — K581 Irritable bowel syndrome with constipation: Secondary | ICD-10-CM

## 2022-09-01 DIAGNOSIS — N1832 Chronic kidney disease, stage 3b: Secondary | ICD-10-CM | POA: Diagnosis not present

## 2022-09-01 DIAGNOSIS — E785 Hyperlipidemia, unspecified: Secondary | ICD-10-CM | POA: Diagnosis not present

## 2022-09-01 LAB — BASIC METABOLIC PANEL
BUN: 17 mg/dL (ref 6–23)
CO2: 26 mEq/L (ref 19–32)
Calcium: 9.3 mg/dL (ref 8.4–10.5)
Chloride: 105 mEq/L (ref 96–112)
Creatinine, Ser: 1.27 mg/dL (ref 0.40–1.50)
GFR: 49.95 mL/min — ABNORMAL LOW (ref >60.00)
Glucose, Bld: 82 mg/dL (ref 70–99)
Potassium: 4 mEq/L (ref 3.5–5.1)
Sodium: 141 mEq/L (ref 135–145)

## 2022-09-01 LAB — CBC WITH DIFFERENTIAL/PLATELET
Basophils Absolute: 0 10*3/uL (ref 0.0–0.1)
Basophils Relative: 0.6 % (ref 0.0–3.0)
Eosinophils Absolute: 0.1 10*3/uL (ref 0.0–0.7)
Eosinophils Relative: 1.5 % (ref 0.0–5.0)
HCT: 45.9 % (ref 39.0–52.0)
Hemoglobin: 15.3 g/dL (ref 13.0–17.0)
Lymphocytes Relative: 23.3 % (ref 12.0–46.0)
Lymphs Abs: 1.6 10*3/uL (ref 0.7–4.0)
MCHC: 33.4 g/dL (ref 30.0–36.0)
MCV: 93 fl (ref 78.0–100.0)
Monocytes Absolute: 0.6 10*3/uL (ref 0.1–1.0)
Monocytes Relative: 9.4 % (ref 3.0–12.0)
Neutro Abs: 4.4 10*3/uL (ref 1.4–7.7)
Neutrophils Relative %: 65.2 % (ref 43.0–77.0)
Platelets: 156 10*3/uL (ref 150.0–400.0)
RBC: 4.94 Mil/uL (ref 4.22–5.81)
RDW: 14 % (ref 11.5–15.5)
WBC: 6.8 10*3/uL (ref 4.0–10.5)

## 2022-09-01 LAB — LIPID PANEL
Cholesterol: 184 mg/dL (ref 0–200)
HDL: 83.4 mg/dL (ref >39.00)
LDL Cholesterol: 91 mg/dL (ref 0–99)
NonHDL: 100.33
Total CHOL/HDL Ratio: 2
Triglycerides: 49 mg/dL (ref 0.0–149.0)
VLDL: 9.8 mg/dL (ref 0.0–40.0)

## 2022-09-01 LAB — HEPATIC FUNCTION PANEL
ALT: 11 U/L (ref 0–53)
AST: 15 U/L (ref 0–37)
Albumin: 4 g/dL (ref 3.5–5.2)
Alkaline Phosphatase: 73 U/L (ref 39–117)
Bilirubin, Direct: 0.2 mg/dL (ref 0.0–0.3)
Total Bilirubin: 0.8 mg/dL (ref 0.2–1.2)
Total Protein: 6.9 g/dL (ref 6.0–8.3)

## 2022-09-01 LAB — TSH: TSH: 3.44 u[IU]/mL (ref 0.35–5.50)

## 2022-09-01 NOTE — Progress Notes (Signed)
Subjective:  Patient ID: Nicholas Frye, male    DOB: [birth year]  Age: 88 y.o. MRN: [id number]  CC: Gastroesophageal Reflux   HPI Zebedee Segundo presents for f/up -  He has mild heartburn that is controlled with Tums.  He is active and denies chest pain, shortness of breath, diaphoresis, or edema.  He deferred on the flu vaccine today.  Outpatient Medications Prior to Visit  Medication Sig Dispense Refill   EPINEPHrine (EPIPEN) 0.3 mg/0.3 mL IJ SOAJ injection Inject 0.3 mLs (0.3 mg total) into the muscle once. 1 Device 3   Multiple Vitamins-Minerals (PRESERVISION AREDS PO) Take by mouth.      No facility-administered medications prior to visit.    ROS Review of Systems  Constitutional: Negative.  Negative for diaphoresis and fatigue.  HENT: Negative.    Eyes: Negative.   Respiratory:  Negative for cough, chest tightness, shortness of breath and wheezing.   Cardiovascular:  Negative for chest pain, palpitations and leg swelling.  Gastrointestinal:  Negative for abdominal pain, constipation, diarrhea, nausea and vomiting.  Endocrine: Negative.   Genitourinary: Negative.  Negative for difficulty urinating and dysuria.  Musculoskeletal: Negative.   Skin: Negative.   Neurological: Negative.  Negative for dizziness and weakness.  Hematological:  Negative for adenopathy. Does not bruise/bleed easily.  Psychiatric/Behavioral: Negative.      Objective:  BP 132/84 (BP Location: Left Arm, Patient Position: Sitting, Cuff Size: Large)   Pulse 66   Temp 98.3 F (36.8 C) (Oral)   Ht '5\' 6"'$  (1.676 m)   Wt 145 lb (65.8 kg)   SpO2 96%   BMI 23.40 kg/m   BP Readings from Last 3 Encounters:  09/01/22 132/84  10/02/21 116/70  06/12/21 118/80    Wt Readings from Last 3 Encounters:  09/01/22 145 lb (65.8 kg)  10/02/21 146 lb 3.2 oz (66.3 kg)  06/12/21 144 lb 6.4 oz (65.5 kg)    Physical Exam Vitals reviewed.  HENT:     Nose: Nose normal.     Mouth/Throat:     Mouth:  Mucous membranes are moist.  Eyes:     General: No scleral icterus.    Conjunctiva/sclera: Conjunctivae normal.  Cardiovascular:     Rate and Rhythm: Normal rate and regular rhythm.     Heart sounds: No murmur heard. Pulmonary:     Effort: Pulmonary effort is normal.     Breath sounds: No stridor. No wheezing, rhonchi or rales.  Abdominal:     General: Abdomen is flat.     Palpations: There is no mass.     Tenderness: There is no abdominal tenderness. There is no guarding.     Hernia: No hernia is present.  Musculoskeletal:        General: Normal range of motion.     Cervical back: Neck supple.     Right lower leg: No edema.     Left lower leg: No edema.  Lymphadenopathy:     Cervical: No cervical adenopathy.  Skin:    General: Skin is warm.  Neurological:     General: No focal deficit present.     Mental Status: He is alert.  Psychiatric:        Mood and Affect: Mood normal.        Behavior: Behavior normal.     Lab Results  Component Value Date   WBC 6.8 09/01/2022   HGB 15.3 09/01/2022   HCT 45.9 09/01/2022   PLT 156.0 09/01/2022   GLUCOSE  82 09/01/2022   CHOL 184 09/01/2022   TRIG 49.0 09/01/2022   HDL 83.40 09/01/2022   LDLDIRECT 101.3 04/03/2011   LDLCALC 91 09/01/2022   ALT 11 09/01/2022   AST 15 09/01/2022   NA 141 09/01/2022   K 4.0 09/01/2022   CL 105 09/01/2022   CREATININE 1.27 09/01/2022   BUN 17 09/01/2022   CO2 26 09/01/2022   TSH 3.44 09/01/2022   PSA 2.03 04/03/2011   HGBA1C 5.3 08/17/2019    DG Finger Index Right  Result Date: 10/13/2018 CLINICAL DATA:  Fall.  Index finger injury. EXAM: RIGHT INDEX FINGER 2+V COMPARISON:  No prior. FINDINGS: Diffuse soft tissue swelling. Tiny bony density noted posterior to the distal aspect of the proximal phalanx of the right second digit. This could represent tiny fracture chip. No other focal abnormality identified. IMPRESSION: Diffuse soft tissue swelling. Tiny bony density noted along the posterior  aspect of the distal phalanx of the right second digit. This could represent a tiny fracture chip. Electronically Signed   By: Marcello Moores  Register   On: 10/13/2018 12:50    Assessment & Plan:   Rhea was seen today for gastroesophageal reflux.  Diagnoses and all orders for this visit:  Gastroesophageal reflux disease without esophagitis- His symptoms are well controlled. -     Basic metabolic panel; Future -     CBC with Differential/Platelet; Future -     CBC with Differential/Platelet -     Basic metabolic panel  Irritable bowel syndrome with constipation  Hyperlipidemia with target LDL less than 100- Statin is not indicated. -     Lipid panel; Future -     Hepatic function panel; Future -     TSH; Future -     TSH -     Hepatic function panel -     Lipid panel  Stage 3b chronic kidney disease ([place])- Will avoid nephrotoxic agents.   I am having [name] maintain his EPINEPHrine and Multiple Vitamins-Minerals (PRESERVISION AREDS PO).  No orders of the defined types were placed in this encounter.    Follow-up: Return in about 6 months (around 03/03/2023).  [name], MD

## 2022-09-01 NOTE — Patient Instructions (Signed)
Gastroesophageal Reflux Disease, Adult Gastroesophageal reflux (GER) happens when acid from the stomach flows up into the tube that connects the mouth and the stomach (esophagus). Normally, food travels down the esophagus and stays in the stomach to be digested. However, when a person has GER, food and stomach acid sometimes move back up into the esophagus. If this becomes a more serious problem, the person may be diagnosed with a disease called gastroesophageal reflux disease (GERD). GERD occurs when the reflux: Happens often. Causes frequent or severe symptoms. Causes problems such as damage to the esophagus. When stomach acid comes in contact with the esophagus, the acid may cause inflammation in the esophagus. Over time, GERD may create small holes (ulcers) in the lining of the esophagus. What are the causes? This condition is caused by a problem with the muscle between the esophagus and the stomach (lower esophageal sphincter, or LES). Normally, the LES muscle closes after food passes through the esophagus to the stomach. When the LES is weakened or abnormal, it does not close properly, and that allows food and stomach acid to go back up into the esophagus. The LES can be weakened by certain dietary substances, medicines, and medical conditions, including: Tobacco use. Pregnancy. Having a hiatal hernia. Alcohol use. Certain foods and beverages, such as coffee, chocolate, onions, and peppermint. What increases the risk? You are more likely to develop this condition if you: Have an increased body weight. Have a connective tissue disorder. Take NSAIDs, such as ibuprofen. What are the signs or symptoms? Symptoms of this condition include: Heartburn. Difficult or painful swallowing and the feeling of having a lump in the throat. A bitter taste in the mouth. Bad breath and having a large amount of saliva. Having an upset or bloated stomach and belching. Chest pain. Different conditions can  cause chest pain. Make sure you see your health care provider if you experience chest pain. Shortness of breath or wheezing. Ongoing (chronic) cough or a nighttime cough. Wearing away of tooth enamel. Weight loss. How is this diagnosed? This condition may be diagnosed based on a medical history and a physical exam. To determine if you have mild or severe GERD, your health care provider may also monitor how you respond to treatment. You may also have tests, including: A test to examine your stomach and esophagus with a small camera (endoscopy). A test that measures the acidity level in your esophagus. A test that measures how much pressure is on your esophagus. A barium swallow or modified barium swallow test to show the shape, size, and functioning of your esophagus. How is this treated? Treatment for this condition may vary depending on how severe your symptoms are. Your health care provider may recommend: Changes to your diet. Medicine. Surgery. The goal of treatment is to help relieve your symptoms and to prevent complications. Follow these instructions at home: Eating and drinking  Follow a diet as recommended by your health care provider. This may involve avoiding foods and drinks such as: Coffee and tea, with or without caffeine. Drinks that contain alcohol. Energy drinks and sports drinks. Carbonated drinks or sodas. Chocolate and cocoa. Peppermint and mint flavorings. Garlic and onions. Horseradish. Spicy and acidic foods, including peppers, chili powder, curry powder, vinegar, hot sauces, and barbecue sauce. Citrus fruit juices and citrus fruits, such as oranges, lemons, and limes. Tomato-based foods, such as red sauce, chili, salsa, and pizza with red sauce. Fried and fatty foods, such as donuts, french fries, potato chips, and high-fat dressings.   High-fat meats, such as hot dogs and fatty cuts of red and white meats, such as rib eye steak, sausage, ham, and  bacon. High-fat dairy items, such as whole milk, butter, and cream cheese. Eat small, frequent meals instead of large meals. Avoid drinking large amounts of liquid with your meals. Avoid eating meals during the 2-3 hours before bedtime. Avoid lying down right after you eat. Do not exercise right after you eat. Lifestyle  Do not use any products that contain nicotine or tobacco. These products include cigarettes, chewing tobacco, and vaping devices, such as e-cigarettes. If you need help quitting, ask your health care provider. Try to reduce your stress by using methods such as yoga or meditation. If you need help reducing stress, ask your health care provider. If you are overweight, reduce your weight to an amount that is healthy for you. Ask your health care provider for guidance about a safe weight loss goal. General instructions Pay attention to any changes in your symptoms. Take over-the-counter and prescription medicines only as told by your health care provider. Do not take aspirin, ibuprofen, or other NSAIDs unless your health care provider told you to take these medicines. Wear loose-fitting clothing. Do not wear anything tight around your waist that causes pressure on your abdomen. Raise (elevate) the head of your bed about 6 inches (15 cm). You can use a wedge to do this. Avoid bending over if this makes your symptoms worse. Keep all follow-up visits. This is important. Contact a health care provider if: You have: New symptoms. Unexplained weight loss. Difficulty swallowing or it hurts to swallow. Wheezing or a persistent cough. A hoarse voice. Your symptoms do not improve with treatment. Get help right away if: You have sudden pain in your arms, neck, jaw, teeth, or back. You suddenly feel sweaty, dizzy, or light-headed. You have chest pain or shortness of breath. You vomit and the vomit is green, yellow, or black, or it looks like blood or coffee grounds. You faint. You  have stool that is red, bloody, or black. You cannot swallow, drink, or eat. These symptoms may represent a serious problem that is an emergency. Do not wait to see if the symptoms will go away. Get medical help right away. Call your local emergency services (911 in the [place] Do not drive yourself to the hospital. Summary Gastroesophageal reflux happens when acid from the stomach flows up into the esophagus. GERD is a disease in which the reflux happens often, causes frequent or severe symptoms, or causes problems such as damage to the esophagus. Treatment for this condition may vary depending on how severe your symptoms are. Your health care provider may recommend diet and lifestyle changes, medicine, or surgery. Contact a health care provider if you have new or worsening symptoms. Take over-the-counter and prescription medicines only as told by your health care provider. Do not take aspirin, ibuprofen, or other NSAIDs unless your health care provider told you to do so. Keep all follow-up visits as told by your health care provider. This is important. This information is not intended to replace advice given to you by your health care provider. Make sure you discuss any questions you have with your health care provider. Document Revised: 05/27/2020 Document Reviewed: 05/27/2020 Elsevier Patient Education  2023 [employer]  

## 2022-09-02 ENCOUNTER — Encounter: Payer: Self-pay | Admitting: Internal Medicine

## 2022-09-02 DIAGNOSIS — Z23 Encounter for immunization: Secondary | ICD-10-CM | POA: Diagnosis not present

## 2022-10-19 ENCOUNTER — Telehealth: Payer: Medicare Other | Admitting: Physician Assistant

## 2022-10-19 DIAGNOSIS — U071 COVID-19: Secondary | ICD-10-CM

## 2022-10-19 MED ORDER — MOLNUPIRAVIR EUA 200MG CAPSULE: 4.0000 | ORAL_CAPSULE | Freq: Two times a day (BID) | ORAL | 0 refills | Status: AC

## 2022-10-19 NOTE — Progress Notes (Signed)
Virtual Visit Consent   Nicholas Frye, you are scheduled for a virtual visit with a [facility] provider today. Just as with appointments in the office, your consent must be obtained to participate. Your consent will be active for this visit and any virtual visit you may have with one of our providers in the next 365 days. If you have a MyChart account, a copy of this consent can be sent to you electronically.  As this is a virtual visit, video technology does not allow for your provider to perform a traditional examination. This may limit your provider's ability to fully assess your condition. If your provider identifies any concerns that need to be evaluated in person or the need to arrange testing (such as labs, EKG, etc.), we will make arrangements to do so. Although advances in technology are sophisticated, we cannot ensure that it will always work on either your end or our end. If the connection with a video visit is poor, the visit may have to be switched to a telephone visit. With either a video or telephone visit, we are not always able to ensure that we have a secure connection.  By engaging in this virtual visit, you consent to the provision of healthcare and authorize for your insurance to be billed (if applicable) for the services provided during this visit. Depending on your insurance coverage, you may receive a charge related to this service.  I need to obtain your verbal consent now. Are you willing to proceed with your visit today? Nicholas Frye has provided verbal consent on 10/19/2022 for a virtual visit (video or telephone). Mar Daring, PA-C  Date: 10/19/2022 12:15 PM  Virtual Visit via Video Note   I, Mar Daring, connected with  Nicholas Frye  ([id number], March 02, 1930) on 10/19/22 at 12:15 PM EST by a video-enabled telemedicine application and verified that I am speaking with the correct person using two identifiers.  Location: Patient: Virtual  Visit Location Patient: Home Provider: Virtual Visit Location Provider: Home Office   I discussed the limitations of evaluation and management by telemedicine and the availability of in person appointments. The patient expressed understanding and agreed to proceed.    Interactive audio and video communications were attempted, although failed due to patient's inability to connect to video. Continued visit with audio only interaction with patient agreement.  History of Present Illness: Nicholas Frye is a 88 y.o. who identifies as a male who was assigned male at birth, and is being seen today for Covid 22.  HPI: URI  This is a new problem. The current episode started in the past 7 days (Saturday to Sunday; tested positive on at home test today). The problem has been gradually worsening. Maximum temperature: 99.1. The fever has been present for Less than 1 day. Associated symptoms include coughing, headaches, rhinorrhea and a sore throat. Pertinent negatives include no congestion, diarrhea, ear pain, nausea, plugged ear sensation, sinus pain, sneezing, vomiting or wheezing. He has tried NSAIDs for the symptoms. The treatment provided no relief.     Problems:  Patient Active Problem List   Diagnosis Date Noted   Stage 3b chronic kidney disease ([place]) 09/01/2022   Encounter for general adult medical examination with abnormal findings 10/02/2021   SCC (squamous cell carcinoma), arm, right 10/01/2020   Cervical spondylosis without myelopathy 06/11/2020   Irritable bowel syndrome with constipation 08/11/2017   DDD (degenerative disc disease), lumbar 07/27/2016   GERD (gastroesophageal reflux disease) 03/01/2014  Hyperlipidemia with target LDL less than 100 03/01/2014   PVD 04/30/2008    Allergies:  Allergies  Allergen Reactions   Yellow Jacket Venom    Bee Venom    Medications:  Current Outpatient Medications:    molnupiravir EUA (LAGEVRIO) 200 mg CAPS capsule, Take 4 capsules (800 mg  total) by mouth 2 (two) times daily for 5 days., Disp: 40 capsule, Rfl: 0   EPINEPHrine (EPIPEN) 0.3 mg/0.3 mL IJ SOAJ injection, Inject 0.3 mLs (0.3 mg total) into the muscle once., Disp: 1 Device, Rfl: 3   Multiple Vitamins-Minerals (PRESERVISION AREDS PO), Take by mouth. , Disp: , Rfl:   Observations/Objective: Patient is well-developed, well-nourished in no acute distress.  Resting comfortably at home.  Head is normocephalic, atraumatic.  No labored breathing.  Speech is clear and coherent with logical content.  Patient is alert and oriented at baseline.    Assessment and Plan: 1. COVID-19 - molnupiravir EUA (LAGEVRIO) 200 mg CAPS capsule; Take 4 capsules (800 mg total) by mouth 2 (two) times daily for 5 days.  Dispense: 40 capsule; Refill: 0  - Continue OTC symptomatic management of choice - Will send OTC vitamins and supplement information through AVS - Molnupiravir prescribed - Patient enrolled in MyChart symptom monitoring - Push fluids - Rest as needed - Discussed return precautions and when to seek in-person evaluation, sent via AVS as well   Follow Up Instructions: I discussed the assessment and treatment plan with the patient. The patient was provided an opportunity to ask questions and all were answered. The patient agreed with the plan and demonstrated an understanding of the instructions.  A copy of instructions were sent to the patient via MyChart unless otherwise noted below.    The patient was advised to call back or seek an in-person evaluation if the symptoms worsen or if the condition fails to improve as anticipated.  Time:  I spent 15 minutes with the patient via telehealth technology discussing the above problems/concerns.    Mar Daring, PA-C

## 2022-10-19 NOTE — Patient Instructions (Signed)
Marlyne Beards, thank you for joining Mar Daring, PA-C for today's virtual visit.  While this provider is not your primary care provider (PCP), if your PCP is located in our provider database this encounter information will be shared with them immediately following your visit.   [facility] account gives you access to today's visit and all your visits, tests, and labs performed at [facility] " click here if you don't have a [facility] account or go to [email or web address]  Consent: (Patient) Marlyne Beards provided verbal consent for this virtual visit at the beginning of the encounter.  Current Medications:  Current Outpatient Medications:    molnupiravir EUA (LAGEVRIO) 200 mg CAPS capsule, Take 4 capsules (800 mg total) by mouth 2 (two) times daily for 5 days., Disp: 40 capsule, Rfl: 0   EPINEPHrine (EPIPEN) 0.3 mg/0.3 mL IJ SOAJ injection, Inject 0.3 mLs (0.3 mg total) into the muscle once., Disp: 1 Device, Rfl: 3   Multiple Vitamins-Minerals (PRESERVISION AREDS PO), Take by mouth. , Disp: , Rfl:    Medications ordered in this encounter:  Meds ordered this encounter  Medications   molnupiravir EUA (LAGEVRIO) 200 mg CAPS capsule    Sig: Take 4 capsules (800 mg total) by mouth 2 (two) times daily for 5 days.    Dispense:  40 capsule    Refill:  0    Order Specific Question:   Supervising Provider    Answer:   Chase Picket [id number]     *If you need refills on other medications prior to your next appointment, please contact your pharmacy*  Follow-Up: Call back or seek an in-person evaluation if the symptoms worsen or if the condition fails to improve as anticipated.  [place] [phone]  Other Instructions  COVID-19 COVID-19, or coronavirus disease 2019, is an infection that is caused by a new (novel) coronavirus called SARS-CoV-2. COVID-19 can cause many symptoms. In some people, the virus may not  cause any symptoms. In others, it may cause mild or severe symptoms. Some people with severe infection develop severe disease. What are the causes? This illness is caused by a virus. The virus may be in the air as tiny specks of fluid (aerosols) or droplets, or it may be on surfaces. You may catch the virus by: Breathing in droplets from an infected person. Droplets can be spread by a person breathing, speaking, singing, coughing, or sneezing. Touching something, like a table or a doorknob, that has virus on it (is contaminated) and then touching your mouth, nose, or eyes. What increases the risk? Risk for infection: You are more likely to get infected with the COVID-19 virus if: You are within 6 ft (1.8 m) of a person with COVID-19 for 15 minutes or longer. You are providing care for a person who is infected with COVID-19. You are in close personal contact with other people. Close personal contact includes hugging, kissing, or sharing eating or drinking utensils. Risk for serious illness caused by COVID-19: You are more likely to get seriously ill from the COVID-19 virus if: You have cancer. You have a long-term (chronic) disease, such as: Chronic lung disease. This includes pulmonary embolism, chronic obstructive pulmonary disease, and cystic fibrosis. Long-term disease that lowers your body's ability to fight infection (immunocompromise). Serious cardiac conditions, such as heart failure, coronary artery disease, or cardiomyopathy. Diabetes. Chronic kidney disease. Liver diseases. These include cirrhosis, nonalcoholic fatty liver disease, alcoholic liver disease, or autoimmune hepatitis.  You have obesity. You are pregnant or were recently pregnant. You have sickle cell disease. What are the signs or symptoms? Symptoms of this condition can range from mild to severe. Symptoms may appear any time from 2 to 14 days after being exposed to the virus. They include: Fever or chills. Shortness  of breath or trouble breathing. Feeling tired or very tired. Headaches, body aches, or muscle aches. Runny or stuffy nose, sneezing, coughing, or sore throat. New loss of taste or smell. This is rare. Some people may also have stomach problems, such as nausea, vomiting, or diarrhea. Other people may not have any symptoms of COVID-19. How is this diagnosed? This condition may be diagnosed by testing samples to check for the COVID-19 virus. The most common tests are the PCR test and the antigen test. Tests may be done in the lab or at home. They include: Using a swab to take a sample of fluid from the back of your nose and throat (nasopharyngeal fluid), from your nose, or from your throat. Testing a sample of saliva from your mouth. Testing a sample of coughed-up mucus from your lungs (sputum). How is this treated? Treatment for COVID-19 infection depends on the severity of the condition. Mild symptoms can be managed at home with rest, fluids, and over-the-counter medicines. Serious symptoms may be treated in a hospital intensive care unit (ICU). Treatment in the ICU may include: Supplemental oxygen. Extra oxygen is given through a tube in the nose, a face mask, or a hood. Medicines. These may include: Antivirals, such as monoclonal antibodies. These help your body fight off certain viruses that can cause disease. Anti-inflammatories, such as corticosteroids. These reduce inflammation and suppress the immune system. Antithrombotics. These prevent or treat blood clots, if they develop. Convalescent plasma. This helps boost your immune system, if you have an underlying immunosuppressive condition or are getting immunosuppressive treatments. Prone positioning. This means you will lie on your stomach. This helps oxygen to get into your lungs. Infection control measures. If you are at risk for more serious illness caused by COVID-19, your health care provider may prescribe two long-acting monoclonal  antibodies, given together every 6 months. How is this prevented? To protect yourself: Use preventive medicine (pre-exposure prophylaxis). You may get pre-exposure prophylaxis if you have moderate or severe immunocompromise. Get vaccinated. Anyone 18 months old or older who meets guidelines can get a COVID-19 vaccine or vaccine series. This includes people who are pregnant or making breast milk (lactating). Get an added dose of COVID-19 vaccine after your first vaccine or vaccine series if you have moderate to severe immunocompromise. This applies if you have had a solid organ transplant or have been diagnosed with an immunocompromising condition. You should get the added dose 4 weeks after you got the first COVID-19 vaccine or vaccine series. If you get an mRNA vaccine, you will need a 3-dose primary series. If you get the J&J/Janssen vaccine, you will need a 2-dose primary series, with the second dose being an mRNA vaccine. Talk to your health care provider about getting experimental monoclonal antibodies. This treatment is approved under emergency use authorization to prevent severe illness before or after being exposed to the COVID-19 virus. You may be given monoclonal antibodies if: You have moderate or severe immunocompromise. This includes treatments that lower your immune response. People with immunocompromise may not develop protection against COVID-19 when they are vaccinated. You cannot be vaccinated. You may not get a vaccine if you have a severe allergic reaction  to the vaccine or its components. You are not fully vaccinated. You are in a facility where COVID-19 is present and: Are in close contact with a person who is infected with the COVID-19 virus. Are at high risk of being exposed to the COVID-19 virus. You are at risk of illness from new variants of the COVID-19 virus. To protect others: If you have symptoms of COVID-19, take steps to prevent the virus from spreading to  others. Stay home. Leave your house only to get medical care. Do not use public transit, if possible. Do not travel while you are sick. Wash your hands often with soap and water for at least 20 seconds. If soap and water are not available, use alcohol-based hand sanitizer. Make sure that all people in your household wash their hands well and often. Cough or sneeze into a tissue or your sleeve or elbow. Do not cough or sneeze into your hand or into the air. Where to find more information Centers for Disease Control and Prevention: CharmCourses.be World Health Organization: https://www.castaneda.info/ Get help right away if: You have trouble breathing. You have pain or pressure in your chest. You are confused. You have bluish lips and fingernails. You have trouble waking from sleep. You have symptoms that get worse. These symptoms may be an emergency. Get help right away. Call 911. Do not wait to see if the symptoms will go away. Do not drive yourself to the hospital. Summary COVID-19 is an infection that is caused by a new coronavirus. Sometimes, there are no symptoms. Other times, symptoms range from mild to severe. Some people with a severe COVID-19 infection develop severe disease. The virus that causes COVID-19 can spread from person to person through droplets or aerosols from breathing, speaking, singing, coughing, or sneezing. Mild symptoms of COVID-19 can be managed at home with rest, fluids, and over-the-counter medicines. This information is not intended to replace advice given to you by your health care provider. Make sure you discuss any questions you have with your health care provider. Document Revised: 11/04/2021 Document Reviewed: 11/06/2021 Elsevier Patient Education  [place].    If you have been instructed to have an in-person evaluation today at a local Urgent Care facility, please use the link below. It will take you to a list of all of our  available [facility], including address, phone number and hours of operation. Please do not delay care.  [facility]  If you or a family member do not have a primary care provider, use the link below to schedule a visit and establish care. When you choose a [facility] primary care physician or advanced practice provider, you gain a long-term partner in health. Find a Primary Care Provider  Learn more about [facility] in-office and virtual care options: [facility]

## 2022-10-19 NOTE — Progress Notes (Signed)
Thank you for the details you included in the comment boxes. Those details are very helpful in determining the best course of treatment for you and help [place] to provide the best care.Because you are covid positive and a candidate for antiviral treatment, we recommend that you convert this visit to a video visit in order for the provider to better assess what is going on.  The provider will be able to give you a more accurate diagnosis and treatment plan if we can more freely discuss your symptoms and with the addition of a virtual examination.   If you convert to a video visit, we will bill your insurance (similar to an office visit) and you will not be charged for this e-Visit. You will be able to stay at home and speak with the first available [facility] advanced practice provider. The link to do a video visit is in the drop down Menu tab of your Welcome screen in [place].  I have spent 5 minutes in review of e-visit questionnaire, review and updating patient chart, medical decision making and response to patient.   Mar Daring, PA-C

## 2022-11-17 DIAGNOSIS — H353132 Nonexudative age-related macular degeneration, bilateral, intermediate dry stage: Secondary | ICD-10-CM | POA: Diagnosis not present

## 2022-11-17 DIAGNOSIS — H53431 Sector or arcuate defects, right eye: Secondary | ICD-10-CM | POA: Diagnosis not present

## 2022-11-17 DIAGNOSIS — Z961 Presence of intraocular lens: Secondary | ICD-10-CM | POA: Diagnosis not present

## 2022-11-17 DIAGNOSIS — H35372 Puckering of macula, left eye: Secondary | ICD-10-CM | POA: Diagnosis not present

## 2022-11-19 DIAGNOSIS — H6123 Impacted cerumen, bilateral: Secondary | ICD-10-CM | POA: Diagnosis not present

## 2022-11-19 DIAGNOSIS — H608X2 Other otitis externa, left ear: Secondary | ICD-10-CM | POA: Diagnosis not present

## 2023-02-01 DIAGNOSIS — H60312 Diffuse otitis externa, left ear: Secondary | ICD-10-CM | POA: Diagnosis not present

## 2023-02-01 DIAGNOSIS — H7292 Unspecified perforation of tympanic membrane, left ear: Secondary | ICD-10-CM | POA: Diagnosis not present

## 2023-04-12 DIAGNOSIS — H7292 Unspecified perforation of tympanic membrane, left ear: Secondary | ICD-10-CM | POA: Diagnosis not present

## 2023-04-12 DIAGNOSIS — H9193 Unspecified hearing loss, bilateral: Secondary | ICD-10-CM | POA: Diagnosis not present

## 2023-04-12 DIAGNOSIS — H60312 Diffuse otitis externa, left ear: Secondary | ICD-10-CM | POA: Diagnosis not present

## 2023-05-06 ENCOUNTER — Ambulatory Visit (INDEPENDENT_AMBULATORY_CARE_PROVIDER_SITE_OTHER): Payer: Medicare Other | Admitting: Internal Medicine

## 2023-05-06 ENCOUNTER — Encounter: Payer: Self-pay | Admitting: Internal Medicine

## 2023-05-06 VITALS — BP 142/82 | HR 69 | Temp 98.2°F | Ht 66.0 in | Wt 145.0 lb

## 2023-05-06 DIAGNOSIS — N1832 Chronic kidney disease, stage 3b: Secondary | ICD-10-CM

## 2023-05-06 DIAGNOSIS — Z0000 Encounter for general adult medical examination without abnormal findings: Secondary | ICD-10-CM | POA: Diagnosis not present

## 2023-05-06 DIAGNOSIS — K219 Gastro-esophageal reflux disease without esophagitis: Secondary | ICD-10-CM | POA: Diagnosis not present

## 2023-05-06 DIAGNOSIS — Z0001 Encounter for general adult medical examination with abnormal findings: Secondary | ICD-10-CM

## 2023-05-06 LAB — URINALYSIS, ROUTINE W REFLEX MICROSCOPIC
Bilirubin Urine: NEGATIVE
Hgb urine dipstick: NEGATIVE
Ketones, ur: NEGATIVE
Leukocytes,Ua: NEGATIVE
Nitrite: NEGATIVE
RBC / HPF: NONE SEEN (ref 0–?)
Specific Gravity, Urine: 1.015 (ref 1.000–1.030)
Total Protein, Urine: NEGATIVE
Urine Glucose: NEGATIVE
Urobilinogen, UA: 0.2 (ref 0.0–1.0)
pH: 7 (ref 5.0–8.0)

## 2023-05-06 LAB — BASIC METABOLIC PANEL
BUN: 17 mg/dL (ref 6–23)
CO2: 25 mEq/L (ref 19–32)
Calcium: 9 mg/dL (ref 8.4–10.5)
Chloride: 105 mEq/L (ref 96–112)
Creatinine, Ser: 1.24 mg/dL (ref 0.40–1.50)
GFR: 51.16 mL/min — ABNORMAL LOW (ref >60.00)
Glucose, Bld: 93 mg/dL (ref 70–99)
Potassium: 3.9 mEq/L (ref 3.5–5.1)
Sodium: 141 mEq/L (ref 135–145)

## 2023-05-06 LAB — CBC WITH DIFFERENTIAL/PLATELET
Basophils Absolute: 0 10*3/uL (ref 0.0–0.1)
Basophils Relative: 0.5 % (ref 0.0–3.0)
Eosinophils Absolute: 0.2 10*3/uL (ref 0.0–0.7)
Eosinophils Relative: 2.4 % (ref 0.0–5.0)
HCT: 47.2 % (ref 39.0–52.0)
Hemoglobin: 15.9 g/dL (ref 13.0–17.0)
Lymphocytes Relative: 20.8 % (ref 12.0–46.0)
Lymphs Abs: 1.6 10*3/uL (ref 0.7–4.0)
MCHC: 33.6 g/dL (ref 30.0–36.0)
MCV: 94.6 fl (ref 78.0–100.0)
Monocytes Absolute: 0.6 10*3/uL (ref 0.1–1.0)
Monocytes Relative: 8.5 % (ref 3.0–12.0)
Neutro Abs: 5.2 10*3/uL (ref 1.4–7.7)
Neutrophils Relative %: 67.8 % (ref 43.0–77.0)
Platelets: 163 10*3/uL (ref 150.0–400.0)
RBC: 4.99 Mil/uL (ref 4.22–5.81)
RDW: 13.6 % (ref 11.5–15.5)
WBC: 7.6 10*3/uL (ref 4.0–10.5)

## 2023-05-06 NOTE — Patient Instructions (Signed)
Chronic Kidney Disease, Adult Chronic kidney disease (CKD) occurs when the kidneys are slowly and permanently damaged over a long period of time. The kidneys are a pair of organs that do many important jobs in the body, including: Removing waste and extra fluid from the blood to make urine. Making hormones that maintain the amount of fluid in tissues and blood vessels. Maintaining the right amount of fluids and chemicals in the body. A small amount of kidney damage may not cause problems, but a large amount of damage may make it hard or impossible for the kidneys to work right. Steps must be taken to slow kidney damage or to stop it from getting worse. If steps are not taken, the kidneys may stop working permanently (end-stage renal disease, or ESRD). Most of the time, CKD does not go away, but it can often be controlled. People who have CKD are usually able to live full lives. What are the causes? The most common causes of this condition are diabetes and high blood pressure (hypertension). Other causes include: Cardiovascular diseases. These affect the heart and blood vessels. Kidney diseases. These include: Glomerulonephritis, or inflammation of the tiny filters in the kidneys. Interstitial nephritis. This is swelling of the small tubes of the kidneys and of the surrounding structures. Polycystic kidney disease, in which clusters of fluid-filled sacs form within the kidneys. Renal vascular disease. This includes disorders that affect the arteries and veins of the kidneys. Diseases that affect the body's defense system (immune system). A problem with urine flow. This may be caused by: Kidney stones. Cancer. An enlarged prostate, in males. A kidney infection or urinary tract infection (UTI) that keeps coming back. Vasculitis. This is swelling or inflammation of the blood vessels. What increases the risk? Your chances of having kidney disease increase with age. The following factors may make  you more likely to develop this condition: A family history of kidney disease or kidney failure. Kidney failure means the kidneys can no longer work right. Certain genetic diseases. Taking medicines often that are damaging to the kidneys. Being around or being in contact with toxic substances. Obesity. A history of tobacco use. What are the signs or symptoms? Symptoms of this condition include: Feeling very tired (lethargic) and having less energy. Swelling, or edema, of the face, legs, ankles, or feet. Nausea or vomiting, or loss of appetite. Confusion or trouble concentrating. Muscle twitches and cramps, especially in the legs. Dry, itchy skin. A metallic taste in the mouth. Producing less urine, or producing more urine (especially at night). Shortness of breath. Trouble sleeping. CKD may also result in not having enough red blood cells or hemoglobin in the blood (anemia) or having weak bones (bone disease). Symptoms develop slowly and may not be obvious until the kidney damage becomes severe. It is possible to have kidney disease for years without having symptoms. How is this diagnosed? This condition may be diagnosed based on: Blood tests. Urine tests. Imaging tests, such as an ultrasound or a CT scan. A kidney biopsy. This involves removing a sample of kidney tissue to be looked at under a microscope. Results from these tests will help to determine how serious the CKD is. How is this treated? There is no cure for most cases of this condition, but treatment usually relieves symptoms and prevents or slows the worsening of the disease. Treatment may include: Diet changes, which may require you to avoid alcohol and foods that are high in salt, potassium, phosphorous, and protein. Medicines. These may:  Lower blood pressure. Control blood sugar (glucose). Relieve anemia. Relieve swelling. Protect your bones. Improve the balance of salts and minerals in your blood  (electrolytes). Dialysis, which is a type of treatment that removes toxic waste from the body. It may be needed if you have kidney failure. Managing any other conditions that are causing your CKD or making it worse. Follow these instructions at home: Medicines Take over-the-counter and prescription medicines only as told by your health care provider. The amount of some medicines that you take may need to be changed. Do not take any new medicines unless approved by your health care provider. Many medicines can make kidney damage worse. Do not take any vitamin and mineral supplements unless approved by your health care provider. Many nutritional supplements can make kidney damage worse. Lifestyle  Do not use any products that contain nicotine or tobacco, such as cigarettes, e-cigarettes, and chewing tobacco. If you need help quitting, ask your health care provider. If you drink alcohol: Limit how much you use to: 0-1 drink a day for women who are not pregnant. 0-2 drinks a day for men. Know how much alcohol is in your drink. In the [place] one drink equals one 12 oz bottle of beer (355 mL), one 5 oz glass of wine (148 mL), or one 1 oz glass of hard liquor (44 mL). Maintain a healthy weight. If you need help, ask your health care provider. General instructions  Follow instructions from your health care provider about eating or drinking restrictions, including any prescribed diet. Track your blood pressure at home. Report changes in your blood pressure as told. If you are being treated for diabetes, track your blood glucose levels as told. Start or continue an exercise plan. Exercise at least 30 minutes a day, 5 days a week. Keep your immunizations up to date as told. Keep all follow-up visits. This is important. Where to find more information American Association of Kidney Patients: ResidentialShow.is [employer]: www.kidney.org American Kidney Fund: FightingMatch.com.ee Life Options:  www.lifeoptions.org Kidney School: www.kidneyschool.org Contact a health care provider if: Your symptoms get worse. You develop new symptoms. Get help right away if: You develop symptoms of ESRD. These include: Headaches. Numbness in your hands or feet. Easy bruising. Frequent hiccups. Chest pain. Shortness of breath. Lack of menstrual periods, in women. You have a fever. You are producing less urine than usual. You have pain or bleeding when you urinate or when you have a bowel movement. These symptoms may represent a serious problem that is an emergency. Do not wait to see if the symptoms will go away. Get medical help right away. Call your local emergency services (911 in the [place] Do not drive yourself to the hospital. Summary Chronic kidney disease (CKD) occurs when the kidneys become damaged slowly over a long period of time. The most common causes of this condition are diabetes and high blood pressure (hypertension). There is no cure for most cases of CKD, but treatment usually relieves symptoms and prevents or slows the worsening of the disease. Treatment may include a combination of lifestyle changes, medicines, and dialysis. This information is not intended to replace advice given to you by your health care provider. Make sure you discuss any questions you have with your health care provider. Document Revised: 02/21/2020 Document Reviewed: 02/21/2020 Elsevier Patient Education  2024 [employer].

## 2023-05-06 NOTE — Progress Notes (Signed)
Subjective:  Patient ID: Nicholas Frye, male    DOB: 1930-04-19  Age: 89 y.o. MRN: [id number]  CC: Hypertension, Gastroesophageal Reflux, and Annual Exam   HPI Zeyden Loucks presents for a CPX and f/up --   He is active and complains of chronic fatigue but denies chest pain, shortness of breath, diaphoresis, or edema.  He has rare heartburn that he controls with over-the-counter medications.  He denies odynophagia, dysphagia, loss of appetite, or weight loss.  Outpatient Medications Prior to Visit  Medication Sig Dispense Refill   EPINEPHrine (EPIPEN) 0.3 mg/0.3 mL IJ SOAJ injection Inject 0.3 mLs (0.3 mg total) into the muscle once. 1 Device 3   Multiple Vitamins-Minerals (PRESERVISION AREDS PO) Take by mouth.      No facility-administered medications prior to visit.    ROS Review of Systems  Constitutional:  Positive for fatigue. Negative for appetite change, chills and diaphoresis.  HENT: Negative.    Eyes: Negative.   Respiratory: Negative.  Negative for cough, chest tightness, shortness of breath and wheezing.   Cardiovascular:  Negative for chest pain, palpitations and leg swelling.  Gastrointestinal:  Negative for abdominal pain, constipation, diarrhea, nausea and vomiting.  Endocrine: Negative.   Genitourinary: Negative.  Negative for difficulty urinating.  Musculoskeletal: Negative.  Negative for arthralgias, joint swelling and myalgias.  Skin: Negative.   Neurological:  Negative for dizziness, weakness, light-headedness and headaches.  Hematological:  Negative for adenopathy. Does not bruise/bleed easily.  Psychiatric/Behavioral: Negative.      Objective:  BP (!) 142/82 (BP Location: Right Arm, Patient Position: Sitting, Cuff Size: Large)   Pulse 69   Temp 98.2 F (36.8 C) (Oral)   Ht 5\' 6"  (1.676 m)   Wt 145 lb (65.8 kg)   SpO2 96%   BMI 23.40 kg/m   BP Readings from Last 3 Encounters:  05/06/23 (!) 142/82  09/01/22 132/84  10/02/21 116/70     Wt Readings from Last 3 Encounters:  05/06/23 145 lb (65.8 kg)  09/01/22 145 lb (65.8 kg)  10/02/21 146 lb 3.2 oz (66.3 kg)    Physical Exam Vitals reviewed.  Constitutional:      Appearance: Normal appearance.  HENT:     Nose: Nose normal.     Mouth/Throat:     Mouth: Mucous membranes are moist.  Eyes:     General: No scleral icterus.    Conjunctiva/sclera: Conjunctivae normal.  Cardiovascular:     Rate and Rhythm: Normal rate and regular rhythm.     Heart sounds: No murmur heard. Pulmonary:     Effort: Pulmonary effort is normal.     Breath sounds: No stridor. No wheezing, rhonchi or rales.  Abdominal:     General: Abdomen is flat.     Palpations: There is no mass.     Tenderness: There is no abdominal tenderness. There is no guarding.     Hernia: No hernia is present.  Musculoskeletal:        General: Normal range of motion.     Cervical back: Neck supple.     Right lower leg: No edema.     Left lower leg: No edema.  Lymphadenopathy:     Cervical: No cervical adenopathy.  Skin:    General: Skin is warm and dry.  Neurological:     General: No focal deficit present.     Mental Status: He is alert. Mental status is at baseline.  Psychiatric:        Mood and Affect:  Mood normal.        Behavior: Behavior normal.        Thought Content: Thought content normal.        Judgment: Judgment normal.     Lab Results  Component Value Date   WBC 7.6 05/06/2023   HGB 15.9 05/06/2023   HCT 47.2 05/06/2023   PLT 163.0 05/06/2023   GLUCOSE 93 05/06/2023   CHOL 184 09/01/2022   TRIG 49.0 09/01/2022   HDL 83.40 09/01/2022   LDLDIRECT 101.3 04/03/2011   LDLCALC 91 09/01/2022   ALT 11 09/01/2022   AST 15 09/01/2022   NA 141 05/06/2023   K 3.9 05/06/2023   CL 105 05/06/2023   CREATININE 1.24 05/06/2023   BUN 17 05/06/2023   CO2 25 05/06/2023   TSH 3.44 09/01/2022   PSA 2.03 04/03/2011   HGBA1C 5.3 08/17/2019    DG Finger Index Right  Result Date:  10/13/2018 CLINICAL DATA:  Fall.  Index finger injury. EXAM: RIGHT INDEX FINGER 2+V COMPARISON:  No prior. FINDINGS: Diffuse soft tissue swelling. Tiny bony density noted posterior to the distal aspect of the proximal phalanx of the right second digit. This could represent tiny fracture chip. No other focal abnormality identified. IMPRESSION: Diffuse soft tissue swelling. Tiny bony density noted along the posterior aspect of the distal phalanx of the right second digit. This could represent a tiny fracture chip. Electronically Signed   By: Maisie Fus  Register   On: 10/13/2018 12:50    Assessment & Plan:   Gastroesophageal reflux disease without esophagitis- His symptoms are well-controlled.  No complications noted. -     CBC with Differential/Platelet; Future  Stage 3b chronic kidney disease (HCC)- His renal function is stable.  Will avoid nephrotoxic agents. -     Basic metabolic panel; Future -     Urinalysis, Routine w reflex microscopic; Future  Encounter for general adult medical examination with abnormal findings- Exam completed, no additional labs indicated, vaccines are up-to-date, no cancer screenings indicated, patient education was given.     Follow-up: Return in about 6 months (around 11/05/2023).  Sanda Linger, MD

## 2023-05-18 DIAGNOSIS — H353132 Nonexudative age-related macular degeneration, bilateral, intermediate dry stage: Secondary | ICD-10-CM | POA: Diagnosis not present

## 2023-05-18 DIAGNOSIS — H35372 Puckering of macula, left eye: Secondary | ICD-10-CM | POA: Diagnosis not present

## 2023-05-18 DIAGNOSIS — Z961 Presence of intraocular lens: Secondary | ICD-10-CM | POA: Diagnosis not present

## 2023-05-27 DIAGNOSIS — H90A32 Mixed conductive and sensorineural hearing loss, unilateral, left ear with restricted hearing on the contralateral side: Secondary | ICD-10-CM | POA: Diagnosis not present

## 2023-06-22 DIAGNOSIS — H90A32 Mixed conductive and sensorineural hearing loss, unilateral, left ear with restricted hearing on the contralateral side: Secondary | ICD-10-CM | POA: Diagnosis not present

## 2023-07-12 DIAGNOSIS — H60312 Diffuse otitis externa, left ear: Secondary | ICD-10-CM | POA: Diagnosis not present

## 2023-07-12 DIAGNOSIS — H7292 Unspecified perforation of tympanic membrane, left ear: Secondary | ICD-10-CM | POA: Diagnosis not present

## 2023-08-06 ENCOUNTER — Telehealth: Payer: Self-pay | Admitting: *Deleted

## 2023-08-06 NOTE — Telephone Encounter (Signed)
Patient states he done his medicare wellness visit at home through is insurance. He does not want to do another one.

## 2023-09-02 DIAGNOSIS — Z23 Encounter for immunization: Secondary | ICD-10-CM | POA: Diagnosis not present

## 2023-11-01 DIAGNOSIS — H7292 Unspecified perforation of tympanic membrane, left ear: Secondary | ICD-10-CM | POA: Diagnosis not present

## 2023-11-01 DIAGNOSIS — H6123 Impacted cerumen, bilateral: Secondary | ICD-10-CM | POA: Diagnosis not present

## 2023-11-01 DIAGNOSIS — H60312 Diffuse otitis externa, left ear: Secondary | ICD-10-CM | POA: Diagnosis not present

## 2023-11-09 DIAGNOSIS — H52223 Regular astigmatism, bilateral: Secondary | ICD-10-CM | POA: Diagnosis not present

## 2023-11-09 DIAGNOSIS — H524 Presbyopia: Secondary | ICD-10-CM | POA: Diagnosis not present

## 2023-11-09 DIAGNOSIS — H35372 Puckering of macula, left eye: Secondary | ICD-10-CM | POA: Diagnosis not present

## 2023-11-09 DIAGNOSIS — H353132 Nonexudative age-related macular degeneration, bilateral, intermediate dry stage: Secondary | ICD-10-CM | POA: Diagnosis not present

## 2023-11-10 ENCOUNTER — Ambulatory Visit: Payer: Medicare Other | Admitting: Internal Medicine

## 2023-11-10 ENCOUNTER — Encounter: Payer: Self-pay | Admitting: Internal Medicine

## 2023-11-10 VITALS — BP 124/76 | HR 59 | Temp 98.0°F | Resp 16 | Ht 66.0 in | Wt 147.6 lb

## 2023-11-10 DIAGNOSIS — Z461 Encounter for fitting and adjustment of hearing aid: Secondary | ICD-10-CM | POA: Insufficient documentation

## 2023-11-10 DIAGNOSIS — H90A32 Mixed conductive and sensorineural hearing loss, unilateral, left ear with restricted hearing on the contralateral side: Secondary | ICD-10-CM | POA: Insufficient documentation

## 2023-11-10 DIAGNOSIS — H3530 Unspecified macular degeneration: Secondary | ICD-10-CM | POA: Insufficient documentation

## 2023-11-10 DIAGNOSIS — R001 Bradycardia, unspecified: Secondary | ICD-10-CM | POA: Diagnosis not present

## 2023-11-10 DIAGNOSIS — K219 Gastro-esophageal reflux disease without esophagitis: Secondary | ICD-10-CM

## 2023-11-10 DIAGNOSIS — N1832 Chronic kidney disease, stage 3b: Secondary | ICD-10-CM

## 2023-11-10 LAB — CBC WITH DIFFERENTIAL/PLATELET
Basophils Absolute: 0.1 10*3/uL (ref 0.0–0.1)
Basophils Relative: 0.7 % (ref 0.0–3.0)
Eosinophils Absolute: 0.3 10*3/uL (ref 0.0–0.7)
Eosinophils Relative: 2.9 % (ref 0.0–5.0)
HCT: 47.6 % (ref 39.0–52.0)
Hemoglobin: 15.9 g/dL (ref 13.0–17.0)
Lymphocytes Relative: 20.7 % (ref 12.0–46.0)
Lymphs Abs: 1.8 10*3/uL (ref 0.7–4.0)
MCHC: 33.5 g/dL (ref 30.0–36.0)
MCV: 95.3 fL (ref 78.0–100.0)
Monocytes Absolute: 0.7 10*3/uL (ref 0.1–1.0)
Monocytes Relative: 8.3 % (ref 3.0–12.0)
Neutro Abs: 6 10*3/uL (ref 1.4–7.7)
Neutrophils Relative %: 67.4 % (ref 43.0–77.0)
Platelets: 166 10*3/uL (ref 150.0–400.0)
RBC: 5 Mil/uL (ref 4.22–5.81)
RDW: 13.7 % (ref 11.5–15.5)
WBC: 8.9 10*3/uL (ref 4.0–10.5)

## 2023-11-10 LAB — BASIC METABOLIC PANEL
BUN: 21 mg/dL (ref 6–23)
CO2: 29 meq/L (ref 19–32)
Calcium: 9.4 mg/dL (ref 8.4–10.5)
Chloride: 104 meq/L (ref 96–112)
Creatinine, Ser: 1.48 mg/dL (ref 0.40–1.50)
GFR: 41.23 mL/min — ABNORMAL LOW (ref >60.00)
Glucose, Bld: 93 mg/dL (ref 70–99)
Potassium: 4.1 meq/L (ref 3.5–5.1)
Sodium: 141 meq/L (ref 135–145)

## 2023-11-10 LAB — TSH: TSH: 5.5 u[IU]/mL (ref 0.35–5.50)

## 2023-11-10 MED ORDER — VOQUEZNA 10 MG PO TABS: 1.0000 | ORAL_TABLET | Freq: Every day | ORAL | Status: DC

## 2023-11-10 NOTE — Patient Instructions (Signed)
Gastroesophageal Reflux Disease, Adult    Gastroesophageal reflux (GER) happens when acid from the stomach flows up into the tube that connects the mouth and the stomach (esophagus). Normally, food travels down the esophagus and stays in the stomach to be digested. However, when a person has GER, food and stomach acid sometimes move back up into the esophagus. If this becomes a more serious problem, the person may be diagnosed with a disease called gastroesophageal reflux disease (GERD). GERD occurs when the reflux:  Happens often.  Causes frequent or severe symptoms.  Causes problems such as damage to the esophagus.  When stomach acid comes in contact with the esophagus, the acid may cause inflammation in the esophagus. Over time, GERD may create small holes (ulcers) in the lining of the esophagus.  What are the causes?  This condition is caused by a problem with the muscle between the esophagus and the stomach (lower esophageal sphincter, or LES). Normally, the LES muscle closes after food passes through the esophagus to the stomach. When the LES is weakened or abnormal, it does not close properly, and that allows food and stomach acid to go back up into the esophagus.  The LES can be weakened by certain dietary substances, medicines, and medical conditions, including:  Tobacco use.  Pregnancy.  Having a hiatal hernia.  Alcohol use.  Certain foods and beverages, such as coffee, chocolate, onions, and peppermint.  What increases the risk?  You are more likely to develop this condition if you:  Have an increased body weight.  Have a connective tissue disorder.  Take NSAIDs, such as ibuprofen.  What are the signs or symptoms?  Symptoms of this condition include:  Heartburn.  Difficult or painful swallowing and the feeling of having a lump in the throat.  A bitter taste in the mouth.  Bad breath and having a large amount of saliva.  Having an upset or bloated stomach and belching.  Chest pain. Different conditions can  cause chest pain. Make sure you see your health care provider if you experience chest pain.  Shortness of breath or wheezing.  Ongoing (chronic) cough or a nighttime cough.  Wearing away of tooth enamel.  Weight loss.  How is this diagnosed?  This condition may be diagnosed based on a medical history and a physical exam. To determine if you have mild or severe GERD, your health care provider may also monitor how you respond to treatment. You may also have tests, including:  A test to examine your stomach and esophagus with a small camera (endoscopy).  A test that measures the acidity level in your esophagus.  A test that measures how much pressure is on your esophagus.  A barium swallow or modified barium swallow test to show the shape, size, and functioning of your esophagus.  How is this treated?  Treatment for this condition may vary depending on how severe your symptoms are. Your health care provider may recommend:  Changes to your diet.  Medicine.  Surgery.  The goal of treatment is to help relieve your symptoms and to prevent complications.  Follow these instructions at home:  Eating and drinking    Follow a diet as recommended by your health care provider. This may involve avoiding foods and drinks such as:  Coffee and tea, with or without caffeine.  Drinks that contain alcohol.  Energy drinks and sports drinks.  Carbonated drinks or sodas.  Chocolate and cocoa.  Peppermint and mint flavorings.  Garlic and onions.  Horseradish.  Spicy and acidic foods, including peppers, chili powder, curry powder, vinegar, hot sauces, and barbecue sauce.  Citrus fruit juices and citrus fruits, such as oranges, lemons, and limes.  Tomato-based foods, such as red sauce, chili, salsa, and pizza with red sauce.  Fried and fatty foods, such as donuts, french fries, potato chips, and high-fat dressings.  High-fat meats, such as hot dogs and fatty cuts of red and white meats, such as rib eye steak, sausage, ham, and  bacon.  High-fat dairy items, such as whole milk, butter, and cream cheese.  Eat small, frequent meals instead of large meals.  Avoid drinking large amounts of liquid with your meals.  Avoid eating meals during the 2-3 hours before bedtime.  Avoid lying down right after you eat.  Do not exercise right after you eat.  Lifestyle    Do not use any products that contain nicotine or tobacco. These products include cigarettes, chewing tobacco, and vaping devices, such as e-cigarettes. If you need help quitting, ask your health care provider.  Try to reduce your stress by using methods such as yoga or meditation. If you need help reducing stress, ask your health care provider.  If you are overweight, reduce your weight to an amount that is healthy for you. Ask your health care provider for guidance about a safe weight loss goal.  General instructions  Pay attention to any changes in your symptoms.  Take over-the-counter and prescription medicines only as told by your health care provider. Do not take aspirin, ibuprofen, or other NSAIDs unless your health care provider told you to take these medicines.  Wear loose-fitting clothing. Do not wear anything tight around your waist that causes pressure on your abdomen.  Raise (elevate) the head of your bed about 6 inches (15 cm). You can use a wedge to do this.  Avoid bending over if this makes your symptoms worse.  Keep all follow-up visits. This is important.  Contact a health care provider if:  You have:  New symptoms.  Unexplained weight loss.  Difficulty swallowing or it hurts to swallow.  Wheezing or a persistent cough.  A hoarse voice.  Your symptoms do not improve with treatment.  Get help right away if:  You have sudden pain in your arms, neck, jaw, teeth, or back.  You suddenly feel sweaty, dizzy, or light-headed.  You have chest pain or shortness of breath.  You vomit and the vomit is green, yellow, or black, or it looks like blood or coffee grounds.  You faint.  You  have stool that is red, bloody, or black.  You cannot swallow, drink, or eat.  These symptoms may represent a serious problem that is an emergency. Do not wait to see if the symptoms will go away. Get medical help right away. Call your local emergency services (911 in the [place] Do not drive yourself to the hospital.  Summary  Gastroesophageal reflux happens when acid from the stomach flows up into the esophagus. GERD is a disease in which the reflux happens often, causes frequent or severe symptoms, or causes problems such as damage to the esophagus.  Treatment for this condition may vary depending on how severe your symptoms are. Your health care provider may recommend diet and lifestyle changes, medicine, or surgery.  Contact a health care provider if you have new or worsening symptoms.  Take over-the-counter and prescription medicines only as told by your health care provider. Do not take aspirin, ibuprofen, or other NSAIDs  unless your health care provider told you to do so.  Keep all follow-up visits as told by your health care provider. This is important.  This information is not intended to replace advice given to you by your health care provider. Make sure you discuss any questions you have with your health care provider.  Document Revised: 05/25/2020 Document Reviewed: 05/27/2020  Elsevier Patient Education  2024 [employer].

## 2023-11-10 NOTE — Progress Notes (Signed)
Subjective:  Patient ID: Nicholas Frye, male    DOB: 03-02-1930  Age: 89 y.o. MRN: [id number]  CC: Gastroesophageal Reflux   HPI [name] presents for f/up ----  Discussed the use of AI scribe software for clinical note transcription with the patient, who gave verbal consent to proceed.  History of Present Illness   The patient, with a history of heart rate on the lower side of normal, presents with complaints of acid reflux, which is triggered by consumption of spicy foods such as barbecue or pizza. He reports that the reflux is not severe and is managed by taking Tums as needed, usually one to two times a week depending on his diet. He has recently reduced his Tums intake from daily to as needed after reading about potential risks associated with daily use. He denies any dysphagia or odynophagia.  The patient also reports a stable weight of around 143-144 pounds, which fluctuates depending on dietary indulgences. He remains active, working in the mornings and maintaining his lawn. He denies any chest pain, shortness of breath, dizziness, lightheadedness, or edema. He has received his flu shot for the season.  He denies any associated symptoms such as weakness, dizziness, or lightheadedness.       Outpatient Medications Prior to Visit  Medication Sig Dispense Refill   EPINEPHrine (EPIPEN) 0.3 mg/0.3 mL IJ SOAJ injection Inject 0.3 mLs (0.3 mg total) into the muscle once. 1 Device 3   Multiple Vitamins-Minerals (PRESERVISION AREDS PO) Take by mouth.      No facility-administered medications prior to visit.    ROS Review of Systems  Constitutional:  Negative for diaphoresis, fatigue and unexpected weight change.  HENT: Negative.  Negative for sinus pressure and trouble swallowing.   Eyes: Negative.   Respiratory:  Negative for chest tightness, shortness of breath, wheezing and stridor.   Cardiovascular:  Negative for chest pain, palpitations and leg swelling.   Gastrointestinal:  Negative for abdominal pain, constipation, diarrhea, nausea and vomiting.  Endocrine: Negative.   Genitourinary: Negative.  Negative for difficulty urinating and dysuria.  Musculoskeletal: Negative.  Negative for arthralgias and myalgias.  Skin: Negative.   Neurological: Negative.  Negative for dizziness and weakness.  Hematological:  Negative for adenopathy. Does not bruise/bleed easily.  Psychiatric/Behavioral: Negative.      Objective:  BP 124/76 (BP Location: Left Arm, Patient Position: Sitting, Cuff Size: Normal)   Pulse (!) 59   Temp 98 F (36.7 C) (Oral)   Resp 16   Ht 5\' 6"  (1.676 m)   Wt 147 lb 9.6 oz (67 kg)   SpO2 96%   BMI 23.82 kg/m   BP Readings from Last 3 Encounters:  11/10/23 124/76  05/06/23 (!) 142/82  09/01/22 132/84    Wt Readings from Last 3 Encounters:  11/10/23 147 lb 9.6 oz (67 kg)  05/06/23 145 lb (65.8 kg)  09/01/22 145 lb (65.8 kg)    Physical Exam Vitals reviewed.  Constitutional:      Appearance: Normal appearance.  HENT:     Mouth/Throat:     Mouth: Mucous membranes are moist.  Eyes:     General: No scleral icterus. Cardiovascular:     Rate and Rhythm: Regular rhythm. Bradycardia present.     Heart sounds: No murmur heard.    No friction rub. No gallop.     Comments: EKG- SB, 57 bpm No LVH, Q waves, or ST/T waves  Pulmonary:     Effort: Pulmonary effort is normal.  Breath sounds: No stridor. No wheezing, rhonchi or rales.  Abdominal:     General: Abdomen is flat.     Palpations: There is no mass.     Tenderness: There is no abdominal tenderness. There is no guarding.     Hernia: No hernia is present.  Musculoskeletal:     Cervical back: Neck supple.     Right lower leg: No edema.     Left lower leg: No edema.  Lymphadenopathy:     Cervical: No cervical adenopathy.  Skin:    General: Skin is warm.     Findings: No lesion or rash.  Neurological:     General: No focal deficit present.     Mental  Status: He is alert. Mental status is at baseline.  Psychiatric:        Mood and Affect: Mood normal.        Behavior: Behavior normal.        Thought Content: Thought content normal.        Judgment: Judgment normal.     Lab Results  Component Value Date   WBC 8.9 11/10/2023   HGB 15.9 11/10/2023   HCT 47.6 11/10/2023   PLT 166.0 11/10/2023   GLUCOSE 93 11/10/2023   CHOL 184 09/01/2022   TRIG 49.0 09/01/2022   HDL 83.40 09/01/2022   LDLDIRECT 101.3 04/03/2011   LDLCALC 91 09/01/2022   ALT 11 09/01/2022   AST 15 09/01/2022   NA 141 11/10/2023   K 4.1 11/10/2023   CL 104 11/10/2023   CREATININE 1.48 11/10/2023   BUN 21 11/10/2023   CO2 29 11/10/2023   TSH 5.50 11/10/2023   PSA 2.03 04/03/2011   HGBA1C 5.3 08/17/2019    DG Finger Index Right  Result Date: 10/13/2018 CLINICAL DATA:  Fall.  Index finger injury. EXAM: RIGHT INDEX FINGER 2+V COMPARISON:  No prior. FINDINGS: Diffuse soft tissue swelling. Tiny bony density noted posterior to the distal aspect of the proximal phalanx of the right second digit. This could represent tiny fracture chip. No other focal abnormality identified. IMPRESSION: Diffuse soft tissue swelling. Tiny bony density noted along the posterior aspect of the distal phalanx of the right second digit. This could represent a tiny fracture chip. Electronically Signed   By: Maisie Fus  Register   On: 10/13/2018 12:50    Assessment & Plan:  Gastroesophageal reflux disease without esophagitis -     Voquezna; Take 1 tablet by mouth daily. -     CBC with Differential/Platelet; Future -     Basic metabolic panel; Future  Bradycardia - He is asx with this. -     EKG 12-Lead -     TSH; Future -     Basic metabolic panel; Future  Stage 3b chronic kidney disease (HCC) - His renal function is stable.     Follow-up: Return in about 6 months (around 05/10/2024).  Sanda Linger, MD

## 2023-12-06 DIAGNOSIS — H35372 Puckering of macula, left eye: Secondary | ICD-10-CM | POA: Diagnosis not present

## 2023-12-06 DIAGNOSIS — H353132 Nonexudative age-related macular degeneration, bilateral, intermediate dry stage: Secondary | ICD-10-CM | POA: Diagnosis not present

## 2023-12-06 DIAGNOSIS — H40023 Open angle with borderline findings, high risk, bilateral: Secondary | ICD-10-CM | POA: Diagnosis not present

## 2023-12-06 DIAGNOSIS — H47022 Hemorrhage in optic nerve sheath, left eye: Secondary | ICD-10-CM | POA: Diagnosis not present

## 2023-12-23 ENCOUNTER — Other Ambulatory Visit: Payer: Self-pay

## 2023-12-23 ENCOUNTER — Other Ambulatory Visit: Payer: Self-pay | Admitting: Internal Medicine

## 2023-12-23 ENCOUNTER — Encounter: Payer: Self-pay | Admitting: Internal Medicine

## 2023-12-23 DIAGNOSIS — K219 Gastro-esophageal reflux disease without esophagitis: Secondary | ICD-10-CM

## 2023-12-23 MED ORDER — VOQUEZNA 10 MG PO TABS: 1.0000 | ORAL_TABLET | Freq: Every day | ORAL | 1 refills | Status: DC

## 2023-12-24 ENCOUNTER — Other Ambulatory Visit: Payer: Self-pay | Admitting: Family Medicine

## 2023-12-24 MED ORDER — PANTOPRAZOLE SODIUM 40 MG PO TBEC: 40.0000 mg | DELAYED_RELEASE_TABLET | Freq: Every day | ORAL | 0 refills | Status: DC

## 2023-12-29 DIAGNOSIS — H90A32 Mixed conductive and sensorineural hearing loss, unilateral, left ear with restricted hearing on the contralateral side: Secondary | ICD-10-CM | POA: Diagnosis not present

## 2024-01-20 ENCOUNTER — Other Ambulatory Visit: Payer: Self-pay | Admitting: Internal Medicine

## 2024-01-20 DIAGNOSIS — K219 Gastro-esophageal reflux disease without esophagitis: Secondary | ICD-10-CM

## 2024-01-20 MED ORDER — FAMOTIDINE 20 MG PO TABS: 20.0000 mg | ORAL_TABLET | Freq: Two times a day (BID) | ORAL | 0 refills | Status: DC

## 2024-01-24 ENCOUNTER — Ambulatory Visit: Payer: Medicare Other

## 2024-01-24 VITALS — BP 128/68 | HR 71 | Ht 64.0 in | Wt 146.8 lb

## 2024-01-24 DIAGNOSIS — Z0000 Encounter for general adult medical examination without abnormal findings: Secondary | ICD-10-CM

## 2024-01-24 NOTE — Progress Notes (Signed)
 Subjective:   Nicholas Frye is a 89 y.o. who presents for a Medicare Wellness preventive visit.  Visit Complete: In person  VideoDeclined- This patient declined Interactive audio and Acupuncturist. Therefore the visit was completed with audio only.  AWV Questionnaire: Yes: Patient Medicare AWV questionnaire was completed by the patient on 01/18/2024; I have confirmed that all information answered by patient is correct and no changes since this date.  Cardiac Risk Factors include: male gender;advanced age (>50men, >72 women);dyslipidemia     Objective:    Today's Vitals   01/24/24 1358  BP: 128/68  Pulse: 71  SpO2: 96%  Weight: 146 lb 12.8 oz (66.6 kg)  Height: 5\' 4"  (1.626 m)   Body mass index is 25.2 kg/m.     01/24/2024    1:57 PM 07/13/2022    3:40 PM 06/12/2021    8:47 AM 08/11/2017    2:09 PM 07/15/2017    1:23 PM 03/28/2016   11:21 AM 03/04/2015    1:40 PM  Advanced Directives  Does Patient Have a Medical Advance Directive? Yes Yes Yes Yes Yes Yes Yes  Type of Estate agent of [place];Living will Healthcare Power of [place];Living will Living will;Healthcare Power of [employer] Power of [employer];Living will Living will;Healthcare Power of [employer] Power of [place];Living will Healthcare Power of Attorney  Does patient want to make changes to medical advance directive?  No - Patient declined No - Patient declined   No - Patient declined No - Patient declined  Copy of Healthcare Power of Attorney in Chart? No - copy requested No - copy requested No - copy requested Yes No - copy requested Yes Yes    Current Medications (verified) Outpatient Encounter Medications as of 01/24/2024  Medication Sig   EPINEPHrine (EPIPEN) 0.3 mg/0.3 mL IJ SOAJ injection Inject 0.3 mLs (0.3 mg total) into the muscle once.   famotidine (PEPCID) 20 MG tablet Take 1 tablet (20 mg total) by mouth 2 (two) times daily.   Multiple  Vitamins-Minerals (PRESERVISION AREDS PO) Take by mouth.    No facility-administered encounter medications on file as of 01/24/2024.    Allergies (verified) Yellow jacket venom and Bee venom   History: Past Medical History:  Diagnosis Date   Elevated prostate specific antigen (PSA)    aged out   GERD (gastroesophageal reflux disease)    Lumbago    Other voice and resonance disorders    damage from reflux   Personal history of colonic polyps    Unspecified tinnitus    resolved   Past Surgical History:  Procedure Laterality Date   CATARACT EXTRACTION Bilateral 11/30/1998   CATARACT EXTRACTION Bilateral 2000   CATARACT EXTRACTION W/ INTRAOCULAR LENS  IMPLANT, BILATERAL     punch biopsy of a lesion on forehead     TONSILLECTOMY     transrectal prostate biopsies that were negative     Family History  Problem Relation Age of Onset   Peripheral vascular disease Mother    Other Mother        bilateral Leg amputation   Coronary artery disease Father    Heart attack Father    Heart disease Father        heart failure/ acute MI   Peripheral vascular disease Sister    Coronary artery disease Brother        valve replaced   Heart disease Brother        MI x2, CABG, AVR   Cancer Neg  Hx        colon or prostate   Diabetes Neg Hx    Social History   Socioeconomic History   Marital status: Married    Spouse name: Not on file   Number of children: 2   Years of education: 12   Highest education level: 12th grade  Occupational History    Comment: retired-still works at [employer]  Tobacco Use   Smoking status: Never   Smokeless tobacco: Never  Substance and Sexual Activity   Alcohol use: Yes    Alcohol/week: 2.0 standard drinks of alcohol    Types: 2 Standard drinks or equivalent per week    Comment: cocktail before dinner   Drug use: No   Sexual activity: Never  Other Topics Concern   Not on file  Social History Narrative   HSG. Married '59. 2 daughters - '68,  '71; 2 grandsons, 1 granddaughter, 2 step-grandchildren. Work: retired from [employer], works at [employer] 2 days /week.  End of life: DNR, no prolonged mechanical ventilation, no heroic or futile measures.    Social Drivers of Corporate investment banker Strain: Low Risk  (01/24/2024)   Overall Financial Resource Strain (CARDIA)    Difficulty of Paying Living Expenses: Not hard at all  Food Insecurity: No Food Insecurity (01/24/2024)   Hunger Vital Sign    Worried About Running Out of Food in the Last Year: Never true    Ran Out of Food in the Last Year: Never true  Transportation Needs: No Transportation Needs (01/24/2024)   PRAPARE - Administrator, Civil Service (Medical): No    Lack of Transportation (Non-Medical): No  Physical Activity: Insufficiently Active (01/24/2024)   Exercise Vital Sign    Days of Exercise per Week: 3 days    Minutes of Exercise per Session: 10 min  Stress: No Stress Concern Present (01/24/2024)   [employer] of Occupational Health - Occupational Stress Questionnaire    Feeling of Stress : Not at all  Social Connections: Socially Integrated (01/24/2024)   Social Connection and Isolation Panel [NHANES]    Frequency of Communication with Friends and Family: Once a week    Frequency of Social Gatherings with Friends and Family: Twice a week    Attends Religious Services: More than 4 times per year    Active Member of [employer] or Organizations: Yes    Attends Engineer, structural: More than 4 times per year    Marital Status: Married    Tobacco Counseling - Non Smoker Counseling given: N/A   Clinical Intake:Completed 01/24/2024  Pre-visit preparation completed: Yes  Pain : No/denies pain     BMI - recorded: 25.2 Nutritional Status: BMI 25 -29 Overweight Nutritional Risks: None Diabetes: No  How often do you need to have someone help you when you read instructions, pamphlets, or other written materials from your  doctor or pharmacy?: 1 - Never  Interpreter Needed?: No  Information entered by :: Hassell Halim, CMA   Activities of Daily Living Completed 01/24/2024    01/24/2024    2:00 PM 01/18/2024    9:21 AM  In your present state of health, do you have any difficulty performing the following activities:  Hearing? 1 1  Vision? 0 0  Difficulty concentrating or making decisions? 0 0  Walking or climbing stairs? 0 0  Dressing or bathing? 0 0  Doing errands, shopping? 0 0  Preparing Food and eating ? N N  Using the  Toilet? N N  In the past six months, have you accidently leaked urine? Y Y  Comment no depend & no Urology referral needed   Do you have problems with loss of bowel control? N N  Managing your Medications? N N  Managing your Finances? N N  Housekeeping or managing your Housekeeping? N N    Patient Care Team: Etta Grandchild, MD as PCP - General (Internal Medicine) Marzella Schlein., MD (Ophthalmology) Bonnielee Haff, MD as Consulting Physician (Family Medicine) Kathyrn Sheriff, [facility] (Inactive) as Pharmacist (Pharmacist)  Indicate any recent Medical Services you may have received from other than [facility] providers in the past year (date may be approximate).     Assessment:   This is a routine wellness examination for [place].  Hearing/Vision screen Hearing Screening - Comments:: Wears hearing aids  Vision Screening - Comments:: Wears rx glasses - up to date with routine eye exams with  Dr Lucretia Roers   Goals Addressed               This Visit's Progress     Patient Stated (pt-stated)        Patient stated he plans on staying active and doing well.       Depression Screen Completed 01/24/2024    01/24/2024    2:02 PM 11/10/2023    1:25 PM 07/03/2022    8:58 AM 10/02/2021    8:40 AM 06/12/2021    8:46 AM 06/11/2020    2:52 PM 08/17/2019   11:15 AM  PHQ 2/9 Scores  PHQ - 2 Score 0 0 0 0 0 0 0  PHQ- 9 Score   0        Fall Risk Completed 01/24/2024    01/24/2024     2:06 PM 01/18/2024    9:21 AM 08/05/2023    9:03 AM 07/13/2022    3:42 PM 10/02/2021    8:40 AM  Fall Risk   Falls in the past year? 0 0 0 0 0  Number falls in past yr: 0 0 0 0 0  Injury with Fall? 0 0 0 0 0  Risk for fall due to : No Fall Risks   No Fall Risks   Follow up Falls prevention discussed;Falls evaluation completed   Falls evaluation completed;Falls prevention discussed     MEDICARE RISK AT HOME: Completed 01/24/2024 Medicare Risk at Home Any stairs in or around the home?: Yes If so, are there any without handrails?: No Home free of loose throw rugs in walkways, pet beds, electrical cords, etc?: Yes Adequate lighting in your home to reduce risk of falls?: Yes Life alert?: Yes Use of a cane, walker or w/c?: No Grab bars in the bathroom?: No Shower chair or bench in shower?: No Elevated toilet seat or a handicapped toilet?: No  TIMED UP AND GO:  Was the test performed?  No  Cognitive Function: 6CIT completed    07/15/2017    1:20 PM 03/04/2015    1:06 PM  MMSE - Mini Mental State Exam  Orientation to time 5 5  Orientation to Place 5 5  Registration 3 3  Attention/ Calculation 5 5  Recall 1 2  Language- name 2 objects 2 2  Language- repeat 1 1  Language- follow 3 step command 3 3  Language- read & follow direction 1 1  Write a sentence 1 1  Copy design 1 1  Total score 28 29        01/24/2024  2:06 PM 07/03/2022    9:01 AM  6CIT Screen  What Year? 0 points 0 points  What month? 0 points 0 points  What time? 0 points 0 points  Count back from 20 0 points 0 points  Months in reverse 0 points 0 points  Repeat phrase 0 points 0 points  Total Score 0 points 0 points    Immunizations Immunization History  Administered Date(s) Administered   Fluad Quad(high Dose 65+) 08/17/2019, 09/11/2020, 09/10/2021, 10/02/2021   Influenza, High Dose Seasonal PF 10/08/2014, 11/02/2016, 10/13/2018, 09/23/2023   Influenza-Unspecified 09/30/2001, 10/14/2015, 11/04/2017,  08/15/2019, 09/11/2020, 08/30/2021, 09/28/2022   PFIZER Comirnaty(Gray Top)Covid-19 Tri-Sucrose Vaccine 05/22/2021   PFIZER(Purple Top)SARS-COV-2 Vaccination 12/24/2019, 01/12/2020, 09/16/2020, 09/16/2021   Pfizer Covid-19 Vaccine Bivalent Booster 40yrs & up 09/07/2021   Pfizer(Comirnaty)Fall Seasonal Vaccine 12 years and older 09/02/2023   Pneumococcal Conjugate-13 10/08/2014   Pneumococcal Polysaccharide-23 11/30/2002, 10/13/2018, 11/10/2021   Pneumococcal-Unspecified 03/16/2002   Td 11/30/2002, 09/14/2013   Tdap 10/03/2013   Zoster Recombinant(Shingrix) 09/10/2021, 11/10/2021, 02/09/2022   Zoster, Live 02/28/2006    Screening Tests Health Maintenance  Topic Date Due   DTaP/Tdap/Td (4 - Td or Tdap) 10/04/2023   Medicare Annual Wellness (AWV)  01/23/2025   Pneumonia Vaccine 19+ Years old  Completed   INFLUENZA VACCINE  Completed   Zoster Vaccines- Shingrix  Completed   HPV VACCINES  Aged Out   COVID-19 Vaccine  Discontinued    Health Maintenance  Health Maintenance Due  Topic Date Due   DTaP/Tdap/Td (4 - Td or Tdap) 10/04/2023   Health Maintenance Items Addressed: 01/24/2024 - Advised to get up-to-date Tdap at local pharmacy.  Additional Screening:  Vision Screening: Recommended annual ophthalmology exams for early detection of glaucoma and other disorders of the eye. Pt sees Dr Lucretia Roers every 6 months to monitor glaucoma per pt stated.  Dental Screening: Recommended annual dental exams for proper oral hygiene  Community Resource Referral / Chronic Care Management: CRR required this visit?  No   CCM required this visit?  No     Plan:     I have personally reviewed and noted the following in the patient's chart:   Medical and social history Use of alcohol, tobacco or illicit drugs  Current medications and supplements including opioid prescriptions. Patient is not currently taking opioid prescriptions. Functional ability and status Nutritional status Physical  activity Advanced directives List of other physicians Hospitalizations, surgeries, and ER visits in previous 12 months Vitals Screenings to include cognitive, depression, and falls Referrals and appointments  In addition, I have reviewed and discussed with patient certain preventive protocols, quality metrics, and best practice recommendations. A written personalized care plan for preventive services as well as general preventive health recommendations were provided to patient.     Darreld Mclean, CMA   01/24/2024   After Visit Summary: (In Person-Printed) AVS printed and given to the patient  Notes: Nothing significant to report at this time.

## 2024-01-24 NOTE — Patient Instructions (Addendum)
 Mr. Burciaga , Thank you for taking time to come for your Medicare Wellness Visit. I appreciate your ongoing commitment to your health goals. Please review the following plan we discussed and let me know if I can assist you in the future.   Referrals/Orders/Follow-Ups/Clinician Recommendations: Aim for 30 minutes of exercise or brisk walking, 6-8 glasses of water, and 5 servings of fruits and vegetables each day. Advised to get up-to-date Tdap vaccine at local pharmacy.  This is a list of the screening recommended for you and due dates:  Health Maintenance  Topic Date Due   DTaP/Tdap/Td vaccine (4 - Td or Tdap) 10/04/2023   Medicare Annual Wellness Visit  01/23/2025   Pneumonia Vaccine  Completed   Flu Shot  Completed   Zoster (Shingles) Vaccine  Completed   HPV Vaccine  Aged Out   COVID-19 Vaccine  Discontinued    Advanced directives: (Copy Requested) Please bring a copy of your health care power of attorney and living will to the office to be added to your chart at your convenience.  Next Medicare Annual Wellness Visit scheduled for next year: Yes - 2026

## 2024-01-26 NOTE — Progress Notes (Addendum)
 Subjective:   Nicholas Frye is a 89 y.o. who presents for a Medicare Wellness preventive visit.  Visit Complete: In person  AWV Questionnaire: Yes: Patient Medicare AWV questionnaire was completed by the patient on 01/18/2024; I have confirmed that all information answered by patient is correct and no changes since this date.  Cardiac Risk Factors include: male gender;advanced age (>63men, >70 women);dyslipidemia     Objective:    Today's Vitals   01/24/24 1358  BP: 128/68  Pulse: 71  SpO2: 96%  Weight: 146 lb 12.8 oz (66.6 kg)  Height: 5\' 4"  (1.626 m)   Body mass index is 25.2 kg/m.     01/24/2024    1:57 PM 07/13/2022    3:40 PM 06/12/2021    8:47 AM 08/11/2017    2:09 PM 07/15/2017    1:23 PM 03/28/2016   11:21 AM 03/04/2015    1:40 PM  Advanced Directives  Does Patient Have a Medical Advance Directive? Yes Yes Yes Yes Yes Yes Yes  Type of Estate agent of [place];Living will Healthcare Power of [place];Living will Living will;Healthcare Power of [employer] Power of [place];Living will Living will;Healthcare Power of [employer] Power of [place];Living will Healthcare Power of Attorney  Does patient want to make changes to medical advance directive?  No - Patient declined No - Patient declined   No - Patient declined No - Patient declined  Copy of Healthcare Power of Attorney in Chart? No - copy requested No - copy requested No - copy requested Yes No - copy requested Yes Yes    Current Medications (verified) Outpatient Encounter Medications as of 01/24/2024  Medication Sig   EPINEPHrine (EPIPEN) 0.3 mg/0.3 mL IJ SOAJ injection Inject 0.3 mLs (0.3 mg total) into the muscle once.   famotidine (PEPCID) 20 MG tablet Take 1 tablet (20 mg total) by mouth 2 (two) times daily.   Multiple Vitamins-Minerals (PRESERVISION AREDS PO) Take by mouth.    No facility-administered encounter medications on file as of 01/24/2024.    Allergies  (verified) Yellow jacket venom and Bee venom   History: Past Medical History:  Diagnosis Date   Elevated prostate specific antigen (PSA)    aged out   GERD (gastroesophageal reflux disease)    Lumbago    Other voice and resonance disorders    damage from reflux   Personal history of colonic polyps    Unspecified tinnitus    resolved   Past Surgical History:  Procedure Laterality Date   CATARACT EXTRACTION Bilateral 11/30/1998   CATARACT EXTRACTION Bilateral 2000   CATARACT EXTRACTION W/ INTRAOCULAR LENS  IMPLANT, BILATERAL     punch biopsy of a lesion on forehead     TONSILLECTOMY     transrectal prostate biopsies that were negative     Family History  Problem Relation Age of Onset   Peripheral vascular disease Mother    Other Mother        bilateral Leg amputation   Coronary artery disease Father    Heart attack Father    Heart disease Father        heart failure/ acute MI   Peripheral vascular disease Sister    Coronary artery disease Brother        valve replaced   Heart disease Brother        MI x2, CABG, AVR   Cancer Neg Hx        colon or prostate   Diabetes Neg Hx  Social History   Socioeconomic History   Marital status: Married    Spouse name: Not on file   Number of children: 2   Years of education: 12   Highest education level: 12th grade  Occupational History    Comment: retired-still works at [employer]  Tobacco Use   Smoking status: Never   Smokeless tobacco: Never  Substance and Sexual Activity   Alcohol use: Yes    Alcohol/week: 2.0 standard drinks of alcohol    Types: 2 Standard drinks or equivalent per week    Comment: cocktail before dinner   Drug use: No   Sexual activity: Never  Other Topics Concern   Not on file  Social History Narrative   HSG. Married '59. 2 daughters - '68, '71; 2 grandsons, 1 granddaughter, 2 step-grandchildren. Work: retired from [employer], works at [employer] 2 days /week.  End of life: DNR,  no prolonged mechanical ventilation, no heroic or futile measures.    Social Drivers of Corporate investment banker Strain: Low Risk  (01/24/2024)   Overall Financial Resource Strain (CARDIA)    Difficulty of Paying Living Expenses: Not hard at all  Food Insecurity: No Food Insecurity (01/24/2024)   Hunger Vital Sign    Worried About Running Out of Food in the Last Year: Never true    Ran Out of Food in the Last Year: Never true  Transportation Needs: No Transportation Needs (01/24/2024)   PRAPARE - Administrator, Civil Service (Medical): No    Lack of Transportation (Non-Medical): No  Physical Activity: Insufficiently Active (01/24/2024)   Exercise Vital Sign    Days of Exercise per Week: 3 days    Minutes of Exercise per Session: 10 min  Stress: No Stress Concern Present (01/24/2024)   [employer] of Occupational Health - Occupational Stress Questionnaire    Feeling of Stress : Not at all  Social Connections: Socially Integrated (01/24/2024)   Social Connection and Isolation Panel [NHANES]    Frequency of Communication with Friends and Family: Once a week    Frequency of Social Gatherings with Friends and Family: Twice a week    Attends Religious Services: More than 4 times per year    Active Member of [employer] or Organizations: Yes    Attends Engineer, structural: More than 4 times per year    Marital Status: Married    Tobacco Counseling - Non Smoker Counseling given: N/A   Clinical Intake:Completed 01/24/2024  Pre-visit preparation completed: Yes  Pain : No/denies pain     BMI - recorded: 25.2 Nutritional Status: BMI 25 -29 Overweight Nutritional Risks: None Diabetes: No  How often do you need to have someone help you when you read instructions, pamphlets, or other written materials from your doctor or pharmacy?: 1 - Never  Interpreter Needed?: No  Information entered by :: Hassell Halim, CMA   Activities of Daily Living Completed  01/24/2024    01/24/2024    2:00 PM 01/18/2024    9:21 AM  In your present state of health, do you have any difficulty performing the following activities:  Hearing? 1 1  Vision? 0 0  Difficulty concentrating or making decisions? 0 0  Walking or climbing stairs? 0 0  Dressing or bathing? 0 0  Doing errands, shopping? 0 0  Preparing Food and eating ? N N  Using the Toilet? N N  In the past six months, have you accidently leaked urine? Y Y  Comment no  depend & no Urology referral needed   Do you have problems with loss of bowel control? N N  Managing your Medications? N N  Managing your Finances? N N  Housekeeping or managing your Housekeeping? N N    Patient Care Team: Etta Grandchild, MD as PCP - General (Internal Medicine) Marzella Schlein., MD (Ophthalmology) Bonnielee Haff, MD as Consulting Physician (Family Medicine) Kathyrn Sheriff, [facility] (Inactive) as Pharmacist (Pharmacist)  Indicate any recent Medical Services you may have received from other than [facility] providers in the past year (date may be approximate).     Assessment:   This is a routine wellness examination for Nicholas Frye.  Hearing/Vision screen Hearing Screening - Comments:: Wears hearing aids  Vision Screening - Comments:: Wears rx glasses - up to date with routine eye exams with  Dr Lucretia Roers   Goals Addressed               This Visit's Progress     Patient Stated (pt-stated)        Patient stated he plans on staying active and doing well.       Depression Screen Completed 01/24/2024    01/24/2024    2:02 PM 11/10/2023    1:25 PM 07/03/2022    8:58 AM 10/02/2021    8:40 AM 06/12/2021    8:46 AM 06/11/2020    2:52 PM 08/17/2019   11:15 AM  PHQ 2/9 Scores  PHQ - 2 Score 0 0 0 0 0 0 0  PHQ- 9 Score   0        Fall Risk Completed 01/24/2024    01/24/2024    2:06 PM 01/18/2024    9:21 AM 08/05/2023    9:03 AM 07/13/2022    3:42 PM 10/02/2021    8:40 AM  Fall Risk   Falls in the past year? 0 0 0 0 0   Number falls in past yr: 0 0 0 0 0  Injury with Fall? 0 0 0 0 0  Risk for fall due to : No Fall Risks   No Fall Risks   Follow up Falls prevention discussed;Falls evaluation completed   Falls evaluation completed;Falls prevention discussed     MEDICARE RISK AT HOME: Completed 01/24/2024 Medicare Risk at Home Any stairs in or around the home?: Yes If so, are there any without handrails?: No Home free of loose throw rugs in walkways, pet beds, electrical cords, etc?: Yes Adequate lighting in your home to reduce risk of falls?: Yes Life alert?: Yes Use of a cane, walker or w/c?: No Grab bars in the bathroom?: No Shower chair or bench in shower?: No Elevated toilet seat or a handicapped toilet?: No  TIMED UP AND GO:  Was the test performed?  No  Cognitive Function: 6CIT completed    07/15/2017    1:20 PM 03/04/2015    1:06 PM  MMSE - Mini Mental State Exam  Orientation to time 5 5  Orientation to Place 5 5  Registration 3 3  Attention/ Calculation 5 5  Recall 1 2  Language- name 2 objects 2 2  Language- repeat 1 1  Language- follow 3 step command 3 3  Language- read & follow direction 1 1  Write a sentence 1 1  Copy design 1 1  Total score 28 29        01/24/2024    2:06 PM 07/03/2022    9:01 AM  6CIT Screen  What Year? 0 points  0 points  What month? 0 points 0 points  What time? 0 points 0 points  Count back from 20 0 points 0 points  Months in reverse 0 points 0 points  Repeat phrase 0 points 0 points  Total Score 0 points 0 points    Immunizations Immunization History  Administered Date(s) Administered   Fluad Quad(high Dose 65+) 08/17/2019, 09/11/2020, 09/10/2021, 10/02/2021   Influenza, High Dose Seasonal PF 10/08/2014, 11/02/2016, 10/13/2018, 09/23/2023   Influenza-Unspecified 09/30/2001, 10/14/2015, 11/04/2017, 08/15/2019, 09/11/2020, 08/30/2021, 09/28/2022   PFIZER Comirnaty(Gray Top)Covid-19 Tri-Sucrose Vaccine 05/22/2021   PFIZER(Purple Top)SARS-COV-2  Vaccination 12/24/2019, 01/12/2020, 09/16/2020, 09/16/2021   Pfizer Covid-19 Vaccine Bivalent Booster 44yrs & up 09/07/2021   Pfizer(Comirnaty)Fall Seasonal Vaccine 12 years and older 09/02/2023   Pneumococcal Conjugate-13 10/08/2014   Pneumococcal Polysaccharide-23 11/30/2002, 10/13/2018, 11/10/2021   Pneumococcal-Unspecified 03/16/2002   Td 11/30/2002, 09/14/2013   Tdap 10/03/2013   Zoster Recombinant(Shingrix) 09/10/2021, 11/10/2021, 02/09/2022   Zoster, Live 02/28/2006    Screening Tests Health Maintenance  Topic Date Due   DTaP/Tdap/Td (4 - Td or Tdap) 10/04/2023   Medicare Annual Wellness (AWV)  01/23/2025   Pneumonia Vaccine 33+ Years old  Completed   INFLUENZA VACCINE  Completed   Zoster Vaccines- Shingrix  Completed   HPV VACCINES  Aged Out   COVID-19 Vaccine  Discontinued    Health Maintenance  Health Maintenance Due  Topic Date Due   DTaP/Tdap/Td (4 - Td or Tdap) 10/04/2023   Health Maintenance Items Addressed: 01/24/2024 - Advised to get up-to-date Tdap at local pharmacy.  Additional Screening:  Vision Screening: Recommended annual ophthalmology exams for early detection of glaucoma and other disorders of the eye. Pt sees Dr Lucretia Roers every 6 months to monitor glaucoma per pt stated.  Dental Screening: Recommended annual dental exams for proper oral hygiene  Community Resource Referral / Chronic Care Management: CRR required this visit?  No   CCM required this visit?  No     Plan:     I have personally reviewed and noted the following in the patient's chart:   Medical and social history Use of alcohol, tobacco or illicit drugs  Current medications and supplements including opioid prescriptions. Patient is not currently taking opioid prescriptions. Functional ability and status Nutritional status Physical activity Advanced directives List of other physicians Hospitalizations, surgeries, and ER visits in previous 12 months Vitals Screenings to include  cognitive, depression, and falls Referrals and appointments  In addition, I have reviewed and discussed with patient certain preventive protocols, quality metrics, and best practice recommendations. A written personalized care plan for preventive services as well as general preventive health recommendations were provided to patient.     Darreld Mclean, CMA   01/26/2024   After Visit Summary: (In Person-Printed) AVS printed and given to the patient  Notes: Nothing significant to report at this time.

## 2024-05-01 ENCOUNTER — Other Ambulatory Visit: Payer: Self-pay | Admitting: Internal Medicine

## 2024-05-01 ENCOUNTER — Encounter: Payer: Self-pay | Admitting: Internal Medicine

## 2024-05-01 DIAGNOSIS — K219 Gastro-esophageal reflux disease without esophagitis: Secondary | ICD-10-CM

## 2024-05-03 ENCOUNTER — Other Ambulatory Visit: Payer: Self-pay

## 2024-05-03 DIAGNOSIS — H7292 Unspecified perforation of tympanic membrane, left ear: Secondary | ICD-10-CM | POA: Diagnosis not present

## 2024-05-03 DIAGNOSIS — K219 Gastro-esophageal reflux disease without esophagitis: Secondary | ICD-10-CM

## 2024-05-03 DIAGNOSIS — H6123 Impacted cerumen, bilateral: Secondary | ICD-10-CM | POA: Diagnosis not present

## 2024-05-03 MED ORDER — FAMOTIDINE 20 MG PO TABS: 20.0000 mg | ORAL_TABLET | Freq: Two times a day (BID) | ORAL | 0 refills | Status: DC

## 2024-05-15 ENCOUNTER — Ambulatory Visit: Payer: Medicare Other | Admitting: Internal Medicine

## 2024-05-16 ENCOUNTER — Encounter: Payer: Self-pay | Admitting: Internal Medicine

## 2024-05-16 ENCOUNTER — Ambulatory Visit (INDEPENDENT_AMBULATORY_CARE_PROVIDER_SITE_OTHER): Payer: Medicare Other | Admitting: Internal Medicine

## 2024-05-16 VITALS — BP 152/80 | HR 59 | Temp 98.2°F | Resp 16 | Ht 64.0 in | Wt 143.6 lb

## 2024-05-16 DIAGNOSIS — E785 Hyperlipidemia, unspecified: Secondary | ICD-10-CM

## 2024-05-16 DIAGNOSIS — I10 Essential (primary) hypertension: Secondary | ICD-10-CM | POA: Diagnosis not present

## 2024-05-16 DIAGNOSIS — Z0000 Encounter for general adult medical examination without abnormal findings: Secondary | ICD-10-CM

## 2024-05-16 DIAGNOSIS — R001 Bradycardia, unspecified: Secondary | ICD-10-CM | POA: Diagnosis not present

## 2024-05-16 DIAGNOSIS — K219 Gastro-esophageal reflux disease without esophagitis: Secondary | ICD-10-CM | POA: Diagnosis not present

## 2024-05-16 DIAGNOSIS — Z0001 Encounter for general adult medical examination with abnormal findings: Secondary | ICD-10-CM

## 2024-05-16 DIAGNOSIS — N1832 Chronic kidney disease, stage 3b: Secondary | ICD-10-CM

## 2024-05-16 DIAGNOSIS — Z85828 Personal history of other malignant neoplasm of skin: Secondary | ICD-10-CM | POA: Insufficient documentation

## 2024-05-16 LAB — URINALYSIS, ROUTINE W REFLEX MICROSCOPIC
Bilirubin Urine: NEGATIVE
Hgb urine dipstick: NEGATIVE
Ketones, ur: NEGATIVE
Leukocytes,Ua: NEGATIVE
Nitrite: NEGATIVE
Specific Gravity, Urine: 1.015 (ref 1.000–1.030)
Total Protein, Urine: NEGATIVE
Urine Glucose: NEGATIVE
Urobilinogen, UA: 0.2 (ref 0.0–1.0)
pH: 6 (ref 5.0–8.0)

## 2024-05-16 LAB — CBC WITH DIFFERENTIAL/PLATELET
Basophils Absolute: 0 10*3/uL (ref 0.0–0.1)
Basophils Relative: 0.5 % (ref 0.0–3.0)
Eosinophils Absolute: 0.1 10*3/uL (ref 0.0–0.7)
Eosinophils Relative: 2 % (ref 0.0–5.0)
HCT: 46 % (ref 39.0–52.0)
Hemoglobin: 15.4 g/dL (ref 13.0–17.0)
Lymphocytes Relative: 17.4 % (ref 12.0–46.0)
Lymphs Abs: 1.2 10*3/uL (ref 0.7–4.0)
MCHC: 33.4 g/dL (ref 30.0–36.0)
MCV: 93.5 fl (ref 78.0–100.0)
Monocytes Absolute: 0.6 10*3/uL (ref 0.1–1.0)
Monocytes Relative: 8.8 % (ref 3.0–12.0)
Neutro Abs: 4.7 10*3/uL (ref 1.4–7.7)
Neutrophils Relative %: 71.3 % (ref 43.0–77.0)
Platelets: 159 10*3/uL (ref 150.0–400.0)
RBC: 4.92 Mil/uL (ref 4.22–5.81)
RDW: 13.5 % (ref 11.5–15.5)
WBC: 6.6 10*3/uL (ref 4.0–10.5)

## 2024-05-16 LAB — HEPATIC FUNCTION PANEL
ALT: 11 U/L (ref 0–53)
AST: 15 U/L (ref 0–37)
Albumin: 4.2 g/dL (ref 3.5–5.2)
Alkaline Phosphatase: 75 U/L (ref 39–117)
Bilirubin, Direct: 0.2 mg/dL (ref 0.0–0.3)
Total Bilirubin: 1 mg/dL (ref 0.2–1.2)
Total Protein: 7.1 g/dL (ref 6.0–8.3)

## 2024-05-16 LAB — BASIC METABOLIC PANEL WITH GFR
BUN: 18 mg/dL (ref 6–23)
CO2: 25 meq/L (ref 19–32)
Calcium: 9 mg/dL (ref 8.4–10.5)
Chloride: 107 meq/L (ref 96–112)
Creatinine, Ser: 1.21 mg/dL (ref 0.40–1.50)
GFR: 52.31 mL/min — ABNORMAL LOW (ref >60.00)
Glucose, Bld: 94 mg/dL (ref 70–99)
Potassium: 3.8 meq/L (ref 3.5–5.1)
Sodium: 141 meq/L (ref 135–145)

## 2024-05-16 LAB — TSH: TSH: 4.22 u[IU]/mL (ref 0.35–5.50)

## 2024-05-16 NOTE — Progress Notes (Unsigned)
 Subjective:  Patient ID: Bartow Zylstra, male    DOB: 04-29-1930  Age: 89 y.o. MRN: [id number]  CC: Annual Exam and Hypertension   HPI Zyren Sevigny presents for a CPX and f/up ----  Discussed the use of AI scribe software for clinical note transcription with the patient, who gave verbal consent to proceed.  History of Present Illness   Brek Reece is a 89 year old male who presents with low heart rate.  He has a low heart rate but no symptoms of weakness, dizziness, or lightheadedness. 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 Recently, he used a push mower for about thirty minutes to mow his son-in-law's yard due to his son-in-law's illness.  He experiences frequent urination in the morning, which he attributes to high fluid intake. No swelling in his legs or feet.       Outpatient Medications Prior to Visit  Medication Sig Dispense Refill   EPINEPHrine  (EPIPEN ) 0.3 mg/0.3 mL IJ SOAJ injection Inject 0.3 mLs (0.3 mg total) into the muscle once. 1 Device 3   famotidine  (PEPCID ) 20 MG tablet Take 1 tablet (20 mg total) by mouth 2 (two) times daily. 90 tablet 0   Multiple Vitamins-Minerals (PRESERVISION AREDS PO) Take by mouth.      No facility-administered medications prior to visit.    ROS Review of Systems  Objective:  BP (!) 152/80 (BP Location: Left Arm, Patient Position: Sitting, Cuff Size: Normal)   Pulse (!) 59   Temp 98.2 F (36.8 C) (Oral)   Resp 16   Ht 5' 4 (1.626 m)   Wt 143 lb 9.6 oz (65.1 kg)   SpO2 97%   BMI 24.65 kg/m   BP Readings from Last 3 Encounters:  05/16/24 (!) 152/80  01/24/24 128/68  11/10/23 124/76    Wt Readings from Last 3 Encounters:  05/16/24 143 lb 9.6 oz (65.1 kg)  01/24/24 146 lb 12.8 oz (66.6 kg)  11/10/23 147 lb 9.6 oz (67 kg)    Physical  Exam Vitals reviewed.  Constitutional:      Appearance: Normal appearance.  HENT:     Mouth/Throat:     Mouth: Mucous membranes are moist.   Eyes:     General: No scleral icterus.   Cardiovascular:     Rate and Rhythm: Bradycardia present.     Heart sounds: No murmur heard.    No friction rub. No gallop.     Comments: EKG-- SB, 59 bpm No LVH, Q waves, or ST/T wave changes  Unchanged  Pulmonary:     Effort: Pulmonary effort is normal.     Breath sounds: No stridor. No wheezing, rhonchi or rales.  Abdominal:     General: Abdomen is flat.     Palpations: There is no mass.     Tenderness: There is no abdominal tenderness. There is no guarding.     Hernia: No hernia is present.   Musculoskeletal:        General: Normal range of motion.     Cervical back: Neck supple.     Right lower leg: No edema.     Left lower leg: No edema.  Lymphadenopathy:     Cervical: No cervical adenopathy.   Skin:    General: Skin is warm and dry.   Neurological:  General: No focal deficit present.     Mental Status: He is alert. Mental status is at baseline.   Psychiatric:        Mood and Affect: Mood normal.        Behavior: Behavior normal.     Lab Results  Component Value Date   WBC 8.9 11/10/2023   HGB 15.9 11/10/2023   HCT 47.6 11/10/2023   PLT 166.0 11/10/2023   GLUCOSE 93 11/10/2023   CHOL 184 09/01/2022   TRIG 49.0 09/01/2022   HDL 83.40 09/01/2022   LDLDIRECT 101.3 04/03/2011   LDLCALC 91 09/01/2022   ALT 11 09/01/2022   AST 15 09/01/2022   NA 141 11/10/2023   K 4.1 11/10/2023   CL 104 11/10/2023   CREATININE 1.48 11/10/2023   BUN 21 11/10/2023   CO2 29 11/10/2023   TSH 5.50 11/10/2023   PSA 2.03 04/03/2011   HGBA1C 5.3 08/17/2019    DG Finger Index Right Result Date: 10/13/2018 CLINICAL DATA:  Fall.  Index finger injury. EXAM: RIGHT INDEX FINGER 2+V COMPARISON:  No prior. FINDINGS: Diffuse soft tissue swelling. Tiny bony density noted posterior to the  distal aspect of the proximal phalanx of the right second digit. This could represent tiny fracture chip. No other focal abnormality identified. IMPRESSION: Diffuse soft tissue swelling. Tiny bony density noted along the posterior aspect of the distal phalanx of the right second digit. This could represent a tiny fracture chip. Electronically Signed   By: Andy Bannister  Register   On: 10/13/2018 12:50    Assessment & Plan:  Stage 3b chronic kidney disease (HCC) -     Urinalysis, Routine w reflex microscopic; Future -     Basic metabolic panel with GFR; Future  Bradycardia -     TSH; Future -     EKG 12-Lead  Encounter for general adult medical examination with abnormal findings  Hyperlipidemia with target LDL less than 100 -     Hepatic function panel; Future  Gastroesophageal reflux disease without esophagitis -     CBC with Differential/Platelet; Future  Primary hypertension -     CBC with Differential/Platelet; Future -     Urinalysis, Routine w reflex microscopic; Future -     Hepatic function panel; Future -     Basic metabolic panel with GFR; Future -     EKG 12-Lead     Follow-up: Return in about 6 months (around 11/15/2024).  Sandra Crouch, MD

## 2024-05-16 NOTE — Patient Instructions (Signed)
 Health Maintenance, Male  Adopting a healthy lifestyle and getting preventive care are important in promoting health and wellness. Ask your health care provider about:  The right schedule for you to have regular tests and exams.  Things you can do on your own to prevent diseases and keep yourself healthy.  What should I know about diet, weight, and exercise?  Eat a healthy diet    Eat a diet that includes plenty of vegetables, fruits, low-fat dairy products, and lean protein.  Do not eat a lot of foods that are high in solid fats, added sugars, or sodium.  Maintain a healthy weight  Body mass index (BMI) is a measurement that can be used to identify possible weight problems. It estimates body fat based on height and weight. Your health care provider can help determine your BMI and help you achieve or maintain a healthy weight.  Get regular exercise  Get regular exercise. This is one of the most important things you can do for your health. Most adults should:  Exercise for at least 150 minutes each week. The exercise should increase your heart rate and make you sweat (moderate-intensity exercise).  Do strengthening exercises at least twice a week. This is in addition to the moderate-intensity exercise.  Spend less time sitting. Even light physical activity can be beneficial.  Watch cholesterol and blood lipids  Have your blood tested for lipids and cholesterol at 88 years of age, then have this test every 5 years.  You may need to have your cholesterol levels checked more often if:  Your lipid or cholesterol levels are high.  You are older than 88 years of age.  You are at high risk for heart disease.  What should I know about cancer screening?  Many types of cancers can be detected early and may often be prevented. Depending on your health history and family history, you may need to have cancer screening at various ages. This may include screening for:  Colorectal cancer.  Prostate cancer.  Skin cancer.  Lung  cancer.  What should I know about heart disease, diabetes, and high blood pressure?  Blood pressure and heart disease  High blood pressure causes heart disease and increases the risk of stroke. This is more likely to develop in people who have high blood pressure readings or are overweight.  Talk with your health care provider about your target blood pressure readings.  Have your blood pressure checked:  Every 3-5 years if you are 9-95 years of age.  Every year if you are 85 years old or older.  If you are between the ages of 29 and 29 and are a current or former smoker, ask your health care provider if you should have a one-time screening for abdominal aortic aneurysm (AAA).  Diabetes  Have regular diabetes screenings. This checks your fasting blood sugar level. Have the screening done:  Once every three years after age 23 if you are at a normal weight and have a low risk for diabetes.  More often and at a younger age if you are overweight or have a high risk for diabetes.  What should I know about preventing infection?  Hepatitis B  If you have a higher risk for hepatitis B, you should be screened for this virus. Talk with your health care provider to find out if you are at risk for hepatitis B infection.  Hepatitis C  Blood testing is recommended for:  Everyone born from [birth year] through 1963.  Anyone  with known risk factors for hepatitis C.  Sexually transmitted infections (STIs)  You should be screened each year for STIs, including gonorrhea and chlamydia, if:  You are sexually active and are younger than 88 years of age.  You are older than 88 years of age and your health care provider tells you that you are at risk for this type of infection.  Your sexual activity has changed since you were last screened, and you are at increased risk for chlamydia or gonorrhea. Ask your health care provider if you are at risk.  Ask your health care provider about whether you are at high risk for HIV. Your health care provider  may recommend a prescription medicine to help prevent HIV infection. If you choose to take medicine to prevent HIV, you should first get tested for HIV. You should then be tested every 3 months for as long as you are taking the medicine.  Follow these instructions at home:  Alcohol use  Do not drink alcohol if your health care provider tells you not to drink.  If you drink alcohol:  Limit how much you have to 0-2 drinks a day.  Know how much alcohol is in your drink. In the [place] one drink equals one 12 oz bottle of beer (355 mL), one 5 oz glass of wine (148 mL), or one 1 oz glass of hard liquor (44 mL).  Lifestyle  Do not use any products that contain nicotine or tobacco. These products include cigarettes, chewing tobacco, and vaping devices, such as e-cigarettes. If you need help quitting, ask your health care provider.  Do not use street drugs.  Do not share needles.  Ask your health care provider for help if you need support or information about quitting drugs.  General instructions  Schedule regular health, dental, and eye exams.  Stay current with your vaccines.  Tell your health care provider if:  You often feel depressed.  You have ever been abused or do not feel safe at home.  Summary  Adopting a healthy lifestyle and getting preventive care are important in promoting health and wellness.  Follow your health care provider's instructions about healthy diet, exercising, and getting tested or screened for diseases.  Follow your health care provider's instructions on monitoring your cholesterol and blood pressure.  This information is not intended to replace advice given to you by your health care provider. Make sure you discuss any questions you have with your health care provider.  Document Revised: 04/07/2021 Document Reviewed: 04/07/2021  Elsevier Patient Education  2024 [employer].

## 2024-05-19 ENCOUNTER — Ambulatory Visit: Payer: Self-pay | Admitting: Internal Medicine

## 2024-05-30 DIAGNOSIS — H353132 Nonexudative age-related macular degeneration, bilateral, intermediate dry stage: Secondary | ICD-10-CM | POA: Diagnosis not present

## 2024-05-30 DIAGNOSIS — H35372 Puckering of macula, left eye: Secondary | ICD-10-CM | POA: Diagnosis not present

## 2024-05-30 DIAGNOSIS — H40023 Open angle with borderline findings, high risk, bilateral: Secondary | ICD-10-CM | POA: Diagnosis not present

## 2024-05-30 DIAGNOSIS — H47022 Hemorrhage in optic nerve sheath, left eye: Secondary | ICD-10-CM | POA: Diagnosis not present

## 2024-08-04 ENCOUNTER — Other Ambulatory Visit: Payer: Self-pay | Admitting: Internal Medicine

## 2024-08-04 DIAGNOSIS — K219 Gastro-esophageal reflux disease without esophagitis: Secondary | ICD-10-CM

## 2024-08-11 ENCOUNTER — Other Ambulatory Visit: Payer: Self-pay

## 2024-08-11 MED ORDER — COVID-19 MRNA VAC-TRIS(PFIZER) 30 MCG/0.3ML IM SUSY: 0.3000 mL | PREFILLED_SYRINGE | Freq: Once | INTRAMUSCULAR | 0 refills | Status: AC

## 2024-08-31 DIAGNOSIS — Z23 Encounter for immunization: Secondary | ICD-10-CM | POA: Diagnosis not present

## 2024-10-24 ENCOUNTER — Other Ambulatory Visit: Payer: Self-pay | Admitting: Internal Medicine

## 2024-10-24 DIAGNOSIS — K219 Gastro-esophageal reflux disease without esophagitis: Secondary | ICD-10-CM

## 2025-01-29 ENCOUNTER — Ambulatory Visit: Payer: Medicare Other
